# Patient Record
Sex: Female | Born: 1955 | Race: White | Hispanic: No | Marital: Married | Smoking: Never smoker
Health system: Southern US, Community
[De-identification: ages and names within clinical notes are randomized; demographics above are authoritative.]

## PROBLEM LIST (undated history)

## (undated) DIAGNOSIS — M545 Low back pain, unspecified: Secondary | ICD-10-CM

## (undated) DIAGNOSIS — E785 Hyperlipidemia, unspecified: Secondary | ICD-10-CM

## (undated) DIAGNOSIS — M5430 Sciatica, unspecified side: Secondary | ICD-10-CM

## (undated) DIAGNOSIS — J029 Acute pharyngitis, unspecified: Secondary | ICD-10-CM

## (undated) DIAGNOSIS — R002 Palpitations: Secondary | ICD-10-CM

## (undated) DIAGNOSIS — R509 Fever, unspecified: Secondary | ICD-10-CM

## (undated) DIAGNOSIS — T7840XA Allergy, unspecified, initial encounter: Secondary | ICD-10-CM

## (undated) DIAGNOSIS — E669 Obesity, unspecified: Secondary | ICD-10-CM

## (undated) DIAGNOSIS — R059 Cough, unspecified: Secondary | ICD-10-CM

## (undated) DIAGNOSIS — R7303 Prediabetes: Secondary | ICD-10-CM

## (undated) DIAGNOSIS — K219 Gastro-esophageal reflux disease without esophagitis: Secondary | ICD-10-CM

## (undated) DIAGNOSIS — Z6835 Body mass index (BMI) 35.0-35.9, adult: Secondary | ICD-10-CM

## (undated) DIAGNOSIS — G4730 Sleep apnea, unspecified: Secondary | ICD-10-CM

## (undated) DIAGNOSIS — I4891 Unspecified atrial fibrillation: Secondary | ICD-10-CM

## (undated) DIAGNOSIS — I10 Essential (primary) hypertension: Secondary | ICD-10-CM

## (undated) DIAGNOSIS — H6690 Otitis media, unspecified, unspecified ear: Secondary | ICD-10-CM

## (undated) HISTORY — DX: Low back pain, unspecified: M54.50

## (undated) HISTORY — DX: Gastro-esophageal reflux disease without esophagitis: K21.9

## (undated) HISTORY — DX: Allergy, unspecified, initial encounter: T78.40XA

## (undated) HISTORY — PX: KNEE SURGERY: SHX244

## (undated) HISTORY — DX: Otitis media, unspecified, unspecified ear: H66.90

## (undated) HISTORY — DX: Essential (primary) hypertension: I10

## (undated) HISTORY — DX: Unspecified atrial fibrillation: I48.91

## (undated) HISTORY — DX: Sciatica, unspecified side: M54.30

## (undated) HISTORY — DX: Sleep apnea, unspecified: G47.30

## (undated) HISTORY — DX: Hyperlipidemia, unspecified: E78.5

## (undated) HISTORY — DX: Prediabetes: R73.03

## (undated) HISTORY — DX: Obesity, unspecified: E66.9

## (undated) HISTORY — PX: COLONOSCOPY: SHX174

## (undated) HISTORY — DX: Acute pharyngitis, unspecified: J02.9

## (undated) HISTORY — DX: Body mass index (BMI) 35.0-35.9, adult: Z68.35

## (undated) HISTORY — DX: Cough, unspecified: R05.9

## (undated) HISTORY — DX: Fever, unspecified: R50.9

## (undated) HISTORY — PX: POLYPECTOMY: SHX149

---

## 2002-08-30 ENCOUNTER — Ambulatory Visit (HOSPITAL_COMMUNITY): Admission: RE | Admit: 2002-08-30 | Discharge: 2002-08-30 | Payer: Self-pay

## 2002-10-08 ENCOUNTER — Ambulatory Visit (HOSPITAL_BASED_OUTPATIENT_CLINIC_OR_DEPARTMENT_OTHER): Admission: RE | Admit: 2002-10-08 | Discharge: 2002-10-08 | Payer: Self-pay | Admitting: Orthopedic Surgery

## 2006-10-07 HISTORY — PX: OTHER SURGICAL HISTORY: SHX169

## 2006-12-24 ENCOUNTER — Encounter: Admission: RE | Admit: 2006-12-24 | Discharge: 2006-12-24 | Payer: Self-pay | Admitting: Orthopedic Surgery

## 2007-01-15 ENCOUNTER — Encounter: Admission: RE | Admit: 2007-01-15 | Discharge: 2007-01-15 | Payer: Self-pay | Admitting: Orthopedic Surgery

## 2007-02-05 ENCOUNTER — Encounter: Admission: RE | Admit: 2007-02-05 | Discharge: 2007-02-05 | Payer: Self-pay | Admitting: Orthopedic Surgery

## 2007-06-05 ENCOUNTER — Emergency Department (HOSPITAL_COMMUNITY): Admission: EM | Admit: 2007-06-05 | Discharge: 2007-06-06 | Payer: Self-pay | Admitting: Emergency Medicine

## 2007-08-17 ENCOUNTER — Inpatient Hospital Stay (HOSPITAL_COMMUNITY): Admission: RE | Admit: 2007-08-17 | Discharge: 2007-08-20 | Payer: Self-pay | Admitting: Neurosurgery

## 2009-07-01 IMAGING — CR DG LUMBAR SPINE 2-3V
1 series · 1 of 1 positions shown · non-contrast
Comparison: none

CLINICAL DATA: Lumbar spondylosis and disc herniation. 
 LUMBAR SPINE ? 2 VIEW 08/17/07:

[view not recorded]
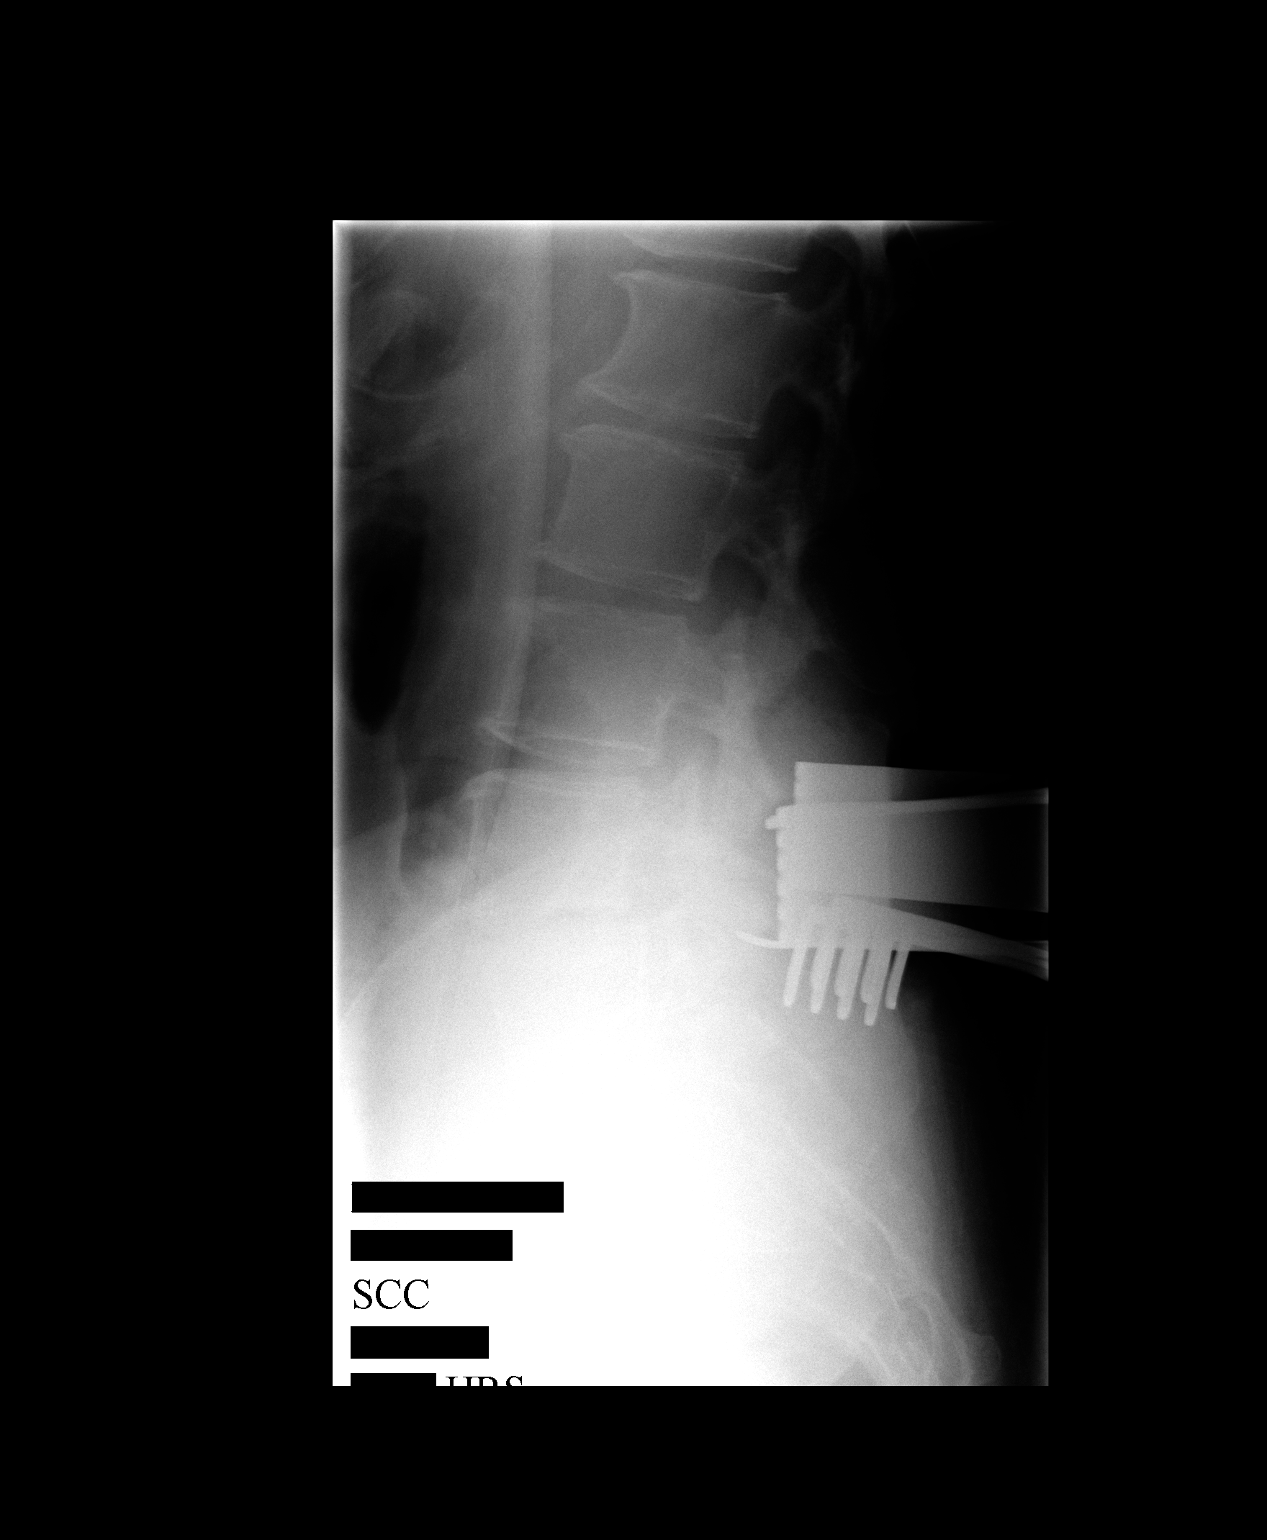

[1 of 1 positions shown; findings below may reference images not displayed]

FINDINGS: The first film at 5015 hours demonstrates placement of a spinal needle opposite the L3 spinous process.  
 Second film at 4304 hours demonstrates retractors and instruments opposite L4 and L5.
IMPRESSION: Lumbar vertebral body localization during posterior lumbar fusion surgery.  L3, L4 and L5 levels were localized.

## 2009-12-14 ENCOUNTER — Encounter: Admission: RE | Admit: 2009-12-14 | Discharge: 2009-12-14 | Payer: Self-pay | Admitting: Family Medicine

## 2011-02-19 NOTE — Op Note (Signed)
[name], [name] NO.:  [id number]   MEDICAL RECORD NO.:  [id number]          PATIENT TYPE:  INP   LOCATION:  3012                         FACILITY:  [facility]   PHYSICIAN:  Clydene Fake, M.D.  DATE OF BIRTH:  18-Feb-1956   DATE OF PROCEDURE:  08/17/2007  DATE OF DISCHARGE:                               OPERATIVE REPORT   PREOPERATIVE DIAGNOSES:  1. Lumbar stenosis.  2. Unstable spondylolisthesis.  3. Spondylosis at the lumbar 3-4 and 4-5 levels.   POSTOPERATIVE DIAGNOSES:  1. Lumbar stenosis.  2. Unstable spondylolisthesis.  3. Spondylosis at the lumbar 3-4 and 4-5 levels.   OPERATION PERFORMED:  1. Decompressive laminectomy.  2. Decompressing of the lumbar 3, lumbar 4 and lumbar 5 nerve roots      bilaterally (three levels).  3. Posterior lumbar interbody fusion, lumbar 3-4 and 4-5 (two levels).  4. Saber interbody cages at two levels at 3-4 and 4-5.  5. Segmented Expedient pedicle screw fixation, lumbar 3 through 5.  6. Posterolateral fusion, lumbar 3 through 5 (two levels), and      autograft at the same incision, infused bone morphogenic protein.   SURGEON:  Clydene Fake, M.D.   ASSISTANT:  Coletta Memos, M.D.   ANESTHESIA:  General endotracheal tube anesthesia.   ESTIMATED BLOOD LOSS:  The estimated blood loss was 500 mL and blood  given was 300 mL, Cell Saver return.   COMPLICATIONS:  None.   DRAINS:  None.   REASON FOR THE OPERATION:  The patient is a 55 year old woman who has  been having severe back and leg pain, worse on the right, and with  trouble walking.  The MRI and x-rays showed unstable spondylolisthesis  at L3-4 and 4-5 with severe stenosis, worse at 4-5.  The patient was  brought in for decompression and fusion.   OPERATION IN DETAIL:  The patient was brought into the operating room  and general anesthesia was induced.  The patient was placed in the prone  position on a Wilson frame.  All pressure points were padded.   The  patient was prepped and draped in a sterile fashion.  The site of  incision was injected with 20 mL of 1% lidocaine with epinephrine.  A  needle was placed in the interspace.  X-rays were obtained.  The needle  was pointing at the L3 spinous process just above the 3-4 interspace.  An incision was then made and centered where the needle was, and  then  actually taken down caudally.  This was then taken down to the fascia  and hemostasis was obtained with Bovie cauterization.  The fascia was  incised to the bottom of L2, all of 3-4 and the top of L5 spinous  processes.  Subperiosteal dissection was carried down to the spinous  processes to the lamina exposing the facets at 3-4 and 4-5, and exposing  the transverse processes of 3, 4 and 5 bilaterally.  Markers were placed  in the 3-4 and 4-5 interspaces and other x-rays were obtained confirming  our positioning.   Self-retaining retractors were placed  and the laminectomy was then  started.  Leksell rongeurs, Kerrison punches and high-speed drill were  used removing the spinous process, the lamina and the medial facets, and  even a little bit more lateral decompression so we could decompress the  3, 4 and 5 roots bilaterally.  Foraminotomies were done over all the  roots and there was some significant narrowing over the left 5 root, and  especially over the right L4 root, which we spent some significant time  decompressing out to the lateral remnant and the rest of the pars, and  decompressing.   Attention was then directed to the disk spaces.  At 4-5 the disk space  was incised.  Diskectomy was done.  We found some lateral disk  herniation that we removed.  This also helped decompress the 4 root.  We  distracted the interspace up to 11 mm and prepared the interspace for  interbody fusion with the various broaches.  This process then was  repeated at the 3-4 level in preparing this disk space and for interbody  fusion.   We then  entered to the 4-5 level.  We were then able to distract up to  11 mm; and, 11 mm high by 9 wide Saber interbody cages were then packed  with infused bone morphogenic protein and autograft bone.  All the bone  remnants were removed during the laminectomy and facetectomies.  It was  cleaned from its soft tissue and chopped into small pieces; and, this  bone was then packed into the two cages.  We packed bone into the  interspace, holding distraction on one side, tapped a Saber cage into  the other, removed the distractor and tapped a second cage at the 4-5  level.  We had countersunk this a few millimeters.  We had good position  of the cages with no pressure on the nerve roots.   Attention was then directed back up the 3-4 and we packed two 9 x 9 mm  Saber cages with infused bone morphogenic protein and autograft bone.  We packed autograft bone into the disk space and tapped the bone grafts  to the cages into place in the interbody space again checking their  position to insure they were a few millimeters down and there was no  pressure on the nerve roots.  We irrigated with antibiotic solution.  We  had good hemostasis in the epidural space.   At this point we decorticated the lateral facets, pars and transverse  processes of L3, L4 and L5 bilaterally using fluoroscopy.  Then the  pedicle entry point for L3 on the right was decorticated with the high-  speed drill.  We placed a pedicle probe down the pedicle, checked it  with a small ball probe and we had good surety of bony circumference.  We tapped the hole and then placed a pedicle screw.  We used Expedient  screws.  A 50 mm screw was used in L3.  A 45 mm screw at L4 and L5.  We  repeated this process on the right side at 4 and 5, placed some screws,  and then we placed three screws on the left side.   Final AP and lateral fluoroscopic images showed good position of the  screws and the interbody cages.  We then placed the infused  bone  morphogenic protein in the posterolateral gutters for posterolateral  fusion in L3 through L5.  We placed rods into the screw heads, placed  locking  nuts and then the final tightening was done.  We placed the rest  of the autograft bone in the posterolateral gutters bilaterally for  posterolateral fusion from L3 to L5.  The retractors were removed.  We  had good hemostasis.  We checked the dura and nerve roots again, and we  had good decompression of the 3, 4 and 5 nerve roots and the central  canal.  We placed some gel over the lateral gutters of the dura and  facets so no bone fragments would go down and hit the roots.  The  retractors were removed.  Then the fascia was closed with 0-Vicryl  interrupted sutures, and the subcutaneous tissue was closed with 0, 2-0  and 3-0 Vicryl interrupted sutures.  The skin was closed benzoin and  Steri-Strips, and a dressing was placed.   The patient was placed back in the supine position where she was  reversed her from anesthesia and was transferred to the recovery room in  stable condition.           ______________________________  Clydene Fake, M.D.     JRH/MEDQ  D:  08/17/2007  T:  08/18/2007  Job:  [id number]

## 2011-02-22 NOTE — Op Note (Signed)
NAME:  [name], COTUGNO NO.:  [id number]   MEDICAL RECORD NO.:  [id number]                   PATIENT TYPE:  AMB   LOCATION:  DSC                                  FACILITY:  [facility]   PHYSICIAN:  Thera Flake., M.D.             DATE OF BIRTH:  04-Oct-1956   DATE OF PROCEDURE:  10/08/2002  DATE OF DISCHARGE:                                 OPERATIVE REPORT   INDICATIONS:  The patient is a 55 year old with catching and pain in the  knee with an MRI showing possible meniscal tearing but osteonecrosis.  She  was followed for four to six weeks with continued catching, was counseled  that the catching may have been on the basis of meniscal tearing, other  diagnoses in the knee or even a fragmentation of her condyle, and was  thought to be amenable to an arthroscopic evaluation.   PREOPERATIVE DIAGNOSES:  1. Torn medial meniscus.  2. Prominent fibrotic plica.  3. Chondromalacia, medial facet of patella.  4. Chondromalacia of lateral compartment.   POSTOPERATIVE DIAGNOSES:  1. Torn medial meniscus.  2. Prominent fibrotic plica.  3. Chondromalacia, medial facet of patella.  4. Chondromalacia of lateral compartment.   OPERATION:  1. Debridement chondroplasty, patellofemoral joint and lateral compartment.  2. Partial medial meniscectomy.  3. Excision of plica.   SURGEON:  Dyke Brackett, M.D.   ANESTHESIA:  General.   DESCRIPTION OF PROCEDURE:  With arthroscope through superomedial,  inferomedial and inferolateral portals, systemic inspection of the knee  showed the patient to have a moderate amount of grade 3 chondromalacia, the  majority of the medial facet of the patella debrided.  The trochlear groove  itself was spared.   There was a very thick fibrotic plica, in fact, it made entering the  superomedial aspect of the joint with the inflow somewhat difficult; this  was band-like, thickly fibrotic, thought potentially a definitely  symptomatic  plica; it was excised.  There as a small fraying tear of the  lateral meniscus requiring resection of about 10% of the meniscus substance.  Thorough probing of the medial femoral condyle, despite the diagnosis of  osteonecrosis, showed no evidence of a collapse or fragmentation.  Lateral  compartment showed mild fibrillation consistent with grade 2 arthritis on  both sides of the joint which was debrided.  ACL showed laxity consistent  with laxity or possible chronic injury.  There was an aberrant band with  possible ligamentum mucosum versus a band of the ACL that was attaching in  an abnormal location, which may have been binding relative to the joint  motion and extension; this was  debrided.  PCL was intact.  Knee drained free of fluid, portals closed with  nylon, knee infiltrated with Marcaine and morphine with addition of 40 mg of  Depo-Medrol intra-articularly with Marcaine alone into the portals; a total  of 32 cc of  0.5% Marcaine were used, light and compressive sterile dressing  applied.                                               Thera Flake., M.D.    WDC/MEDQ  D:  10/08/2002  T:  10/08/2002  Job:  [id number]

## 2011-02-22 NOTE — Discharge Summary (Signed)
[name], [name] NO.:  [id number]   MEDICAL RECORD NO.:  [id number]          PATIENT TYPE:  INP   LOCATION:  3012                         FACILITY:  [facility]   PHYSICIAN:  Clydene Fake, M.D.  DATE OF BIRTH:  [birth year]   DATE OF ADMISSION:  08/17/2007  DATE OF DISCHARGE:  08/20/2007                               DISCHARGE SUMMARY   ADMISSION DIAGNOSIS:  Stenosis, unstable spondylolisthesis and  spondylosis.   DISCHARGE DIAGNOSIS:  Stenosis, unstable spondylolisthesis and  spondylosis.   PROCEDURE:  Decompressive laminectomy of L3, 4 and 5 with posterior  interbody fusion at L3-4 and L4-5 with Saber interbody cages at 2  levels, Expedient pedicles for fixation at L3 through L5, posterolateral  fusions L3 through L5 with BMP and autograft.   REASON FOR ADMISSION:  The patient is a 55 year old woman who has been  having back and leg pain, trouble walking, severe stenosis at L4-5 and  L3-4.  The patient admitted for decompression and fusion.   HOSPITAL COURSE:  The patient admitted on the day of surgery and  underwent the procedure above without complication.  Postoperative, the  patient was transferred to the recovery room and then to the floor.  She  did well with less pain right away, was up ambulating.  PT/OT worked  with the patient and continued to do well.  Incision looked good.   The patient could ambulate much better with much less leg pain and she  was doing great and was discharged home in stable condition on August 20, 2007 with discharge meds same as prehospitalization plus Cipro for a  week, Vicodin ES p.r.n., Flexeril p.r.n.  Followup will be in 2 to 3  weeks in my office.  Up with brace, no strenuous activity.           ______________________________  Clydene Fake, M.D.     JRH/MEDQ  D:  09/10/2007  T:  09/10/2007  Job:  [id number]

## 2011-07-16 LAB — TYPE AND SCREEN: ABO/RH(D): O POS

## 2011-07-16 LAB — BASIC METABOLIC PANEL
BUN: 11
Calcium: 9.9
GFR calc non Af Amer: >60
Potassium: 4
Sodium: 141

## 2011-07-16 LAB — URINALYSIS, ROUTINE W REFLEX MICROSCOPIC
Bilirubin Urine: NEGATIVE
Hgb urine dipstick: NEGATIVE
Specific Gravity, Urine: 1.011
pH: 7

## 2011-07-16 LAB — APTT: aPTT: 25

## 2011-07-16 LAB — PROTIME-INR: INR: 0.8

## 2011-07-16 LAB — URINE MICROSCOPIC-ADD ON

## 2011-07-16 LAB — CBC
HCT: 41.1
Platelets: 217
WBC: 5.1

## 2015-10-24 ENCOUNTER — Encounter: Payer: Self-pay | Admitting: Internal Medicine

## 2015-12-12 ENCOUNTER — Ambulatory Visit (AMBULATORY_SURGERY_CENTER): Payer: Self-pay | Admitting: *Deleted

## 2015-12-12 VITALS — Ht 64.5 in | Wt 230.0 lb

## 2015-12-12 DIAGNOSIS — Z1211 Encounter for screening for malignant neoplasm of colon: Secondary | ICD-10-CM

## 2015-12-12 MED ORDER — NA SULFATE-K SULFATE-MG SULF 17.5-3.13-1.6 GM/177ML PO SOLN: 1.0000 | Freq: Once | ORAL | Status: DC

## 2015-12-12 NOTE — Progress Notes (Signed)
No egg or soy allergy known to patient   issues with past sedation with any surgeries  or procedures is N/V post op , no intubation problems  No diet pills per patient No home 02 use per patient  No blood thinners per patient    emmi video to e mail      [email or web address]  Pt has a bridge that is not removal RS to 01-23-16 per pt's request due to work

## 2015-12-26 ENCOUNTER — Encounter: Payer: Self-pay | Admitting: Internal Medicine

## 2016-01-23 ENCOUNTER — Encounter: Payer: Self-pay | Admitting: Internal Medicine

## 2016-01-23 ENCOUNTER — Ambulatory Visit (AMBULATORY_SURGERY_CENTER): Payer: BC Managed Care – PPO | Admitting: Internal Medicine

## 2016-01-23 VITALS — BP 134/73 | HR 64 | Temp 99.8°F | Resp 20 | Ht 64.5 in | Wt 230.0 lb

## 2016-01-23 DIAGNOSIS — Z1211 Encounter for screening for malignant neoplasm of colon: Secondary | ICD-10-CM | POA: Diagnosis present

## 2016-01-23 DIAGNOSIS — D125 Benign neoplasm of sigmoid colon: Secondary | ICD-10-CM

## 2016-01-23 DIAGNOSIS — D124 Benign neoplasm of descending colon: Secondary | ICD-10-CM

## 2016-01-23 MED ORDER — SODIUM CHLORIDE 0.9 % IV SOLN: 500.0000 mL | INTRAVENOUS | Status: DC

## 2016-01-23 NOTE — Progress Notes (Signed)
Called to room to assist during endoscopic procedure.  Patient ID and intended procedure confirmed with present staff. Received instructions for my participation in the procedure from the performing physician.  

## 2016-01-23 NOTE — Progress Notes (Signed)
To recovery, report to [facility],, RN, VSS.

## 2016-01-23 NOTE — Op Note (Addendum)
[facility] Patient Name: [name] Procedure Date: 01/23/2016 2:01 PM MRN: [id number] Endoscopist: Jerene Bears , MD Age: 60 Date of Birth: 15-Mar-1956 Gender: Female Procedure:                Colonoscopy Indications:              Screening for colorectal malignant neoplasm, This                            is the patient's first colonoscopy Medicines:                Monitored Anesthesia Care Procedure:                Pre-Anesthesia Assessment:                           - Prior to the procedure, a History and Physical                            was performed, and patient medications and                            allergies were reviewed. The patient's tolerance of                            previous anesthesia was also reviewed. The risks                            and benefits of the procedure and the sedation                            options and risks were discussed with the patient.                            All questions were answered, and informed consent                            was obtained. Prior Anticoagulants: The patient has                            taken no previous anticoagulant or antiplatelet                            agents. ASA Grade Assessment: II - A patient with                            mild systemic disease. After reviewing the risks                            and benefits, the patient was deemed in                            satisfactory condition to undergo the procedure.  After obtaining informed consent, the colonoscope                            was passed under direct vision. Throughout the                            procedure, the patient's blood pressure, pulse, and                            oxygen saturations were monitored continuously. The                            Model PCF-H190DL [id number]) scope was introduced                            through the anus and advanced to the the cecum,              identified by appendiceal orifice and ileocecal                            valve. The colonoscopy was performed without                            difficulty. The patient tolerated the procedure                            well. The quality of the bowel preparation was                            good. The ileocecal valve, appendiceal orifice, and                            rectum were photographed. Scope In: 2:11:18 PM Scope Out: 2:25:18 PM Scope Withdrawal Time: 0 hours 12 minutes 12 seconds  Total Procedure Duration: 0 hours 14 minutes 0 seconds  Findings:                 The digital rectal exam was normal.                           Two sessile polyps were found in the sigmoid colon                            and descending colon. The polyps were 6 to 8 mm in                            size. These polyps were removed with a hot snare.                            Resection and retrieval were complete.                           Two sessile polyps were found in the descending  colon. The polyps were 4 to 5 mm in size. These                            polyps were removed with a cold snare. Resection                            and retrieval were complete.                           Internal hemorrhoids were found during                            retroflexion. The hemorrhoids were small. Complications:            No immediate complications. Estimated Blood Loss:     Estimated blood loss: none. Impression:               - Two 6 to 8 mm polyps in the sigmoid colon and in                            the descending colon, removed with a hot snare.                            Resected and retrieved.                           - Two 4 to 5 mm polyps in the descending colon,                            removed with a cold snare. Resected and retrieved. Recommendation:           - Patient has a contact number available for                            emergencies. The  signs and symptoms of potential                            delayed complications were discussed with the                            patient. Return to normal activities tomorrow.                            Written discharge instructions were provided to the                            patient.                           - Resume previous diet.                           - Continue present medications.                           -  No ibuprofen, naproxen, or other non-steroidal                            anti-inflammatory drugs for 2 weeks after polyp                            removal.                           - Await pathology results.                           - Repeat colonoscopy is recommended for                            surveillance. The colonoscopy date will be                            determined after pathology results from today's                            exam become available for review. Jerene Bears, MD 01/23/2016 2:30:37 PM This report has been signed electronically. Addendum Number: 1   Addendum Date: 01/23/2016 2:41:03 PM      Mild diverticulosis was present in the left colon. Jerene Bears, MD 01/23/2016 2:41:18 PM This report has been signed electronically.

## 2016-01-23 NOTE — Patient Instructions (Signed)
YOU HAD AN ENDOSCOPIC PROCEDURE TODAY AT [facility]:   Refer to the procedure report that was given to you for any specific questions about what was found during the examination.  If the procedure report does not answer your questions, please call your gastroenterologist to clarify.  If you requested that your care partner not be given the details of your procedure findings, then the procedure report has been included in a sealed envelope for you to review at your convenience later.  YOU SHOULD EXPECT: Some feelings of bloating in the abdomen. Passage of more gas than usual.  Walking can help get rid of the air that was put into your GI tract during the procedure and reduce the bloating. If you had a lower endoscopy (such as a colonoscopy or flexible sigmoidoscopy) you may notice spotting of blood in your stool or on the toilet paper. If you underwent a bowel prep for your procedure, you may not have a normal bowel movement for a few days.  Please Note:  You might notice some irritation and congestion in your nose or some drainage.  This is from the oxygen used during your procedure.  There is no need for concern and it should clear up in a day or so.  SYMPTOMS TO REPORT IMMEDIATELY:   Following lower endoscopy (colonoscopy or flexible sigmoidoscopy):  Excessive amounts of blood in the stool  Significant tenderness or worsening of abdominal pains  Swelling of the abdomen that is new, acute  Fever of 100F or higher    For urgent or emergent issues, a gastroenterologist can be reached at any hour by calling [phone].   DIET: Your first meal following the procedure should be a small meal and then it is ok to progress to your normal diet. Heavy or fried foods are harder to digest and may make you feel nauseous or bloated.  Likewise, meals heavy in dairy and vegetables can increase bloating.  Drink plenty of fluids but you should avoid alcoholic beverages for 24  hours.  ACTIVITY:  You should plan to take it easy for the rest of today and you should NOT DRIVE or use heavy machinery until tomorrow (because of the sedation medicines used during the test).    FOLLOW UP: Our staff will call the number listed on your records the next business day following your procedure to check on you and address any questions or concerns that you may have regarding the information given to you following your procedure. If we do not reach you, we will leave a message.  However, if you are feeling well and you are not experiencing any problems, there is no need to return our call.  We will assume that you have returned to your regular daily activities without incident.  If any biopsies were taken you will be contacted by phone or by letter within the next 1-3 weeks.  Please call us at [phone] if you have not heard about the biopsies in 3 weeks.    SIGNATURES/CONFIDENTIALITY: You and/or your care partner have signed paperwork which will be entered into your electronic medical record.  These signatures attest to the fact that that the information above on your After Visit Summary has been reviewed and is understood.  Full responsibility of the confidentiality of this discharge information lies with you and/or your care-partner.   NO ASPIRIN OR ANTI INFLAMMATORY PRODUCTS FOR 2 WEEKS   INFORMATION ON POLYPS AND HEMORRHOIDS GIVEN TO YOU TODAY

## 2016-01-23 NOTE — Progress Notes (Signed)
No egg or soy allergy known to patient  No issues with past sedation with any surgeries  or procedures, no intubation problems  No diet pills per patient No home 02 use per patient  No blood thinners per patient  Pt denies issues with constipation   

## 2016-01-24 ENCOUNTER — Telehealth: Payer: Self-pay | Admitting: *Deleted

## 2016-01-24 NOTE — Telephone Encounter (Signed)
  Follow up Call-  Call back number 01/23/2016  Post procedure Call Back phone  # [phone]  Permission to leave phone message Yes     Patient questions:  Do you have a fever, pain , or abdominal swelling? No. Pain Score  0 *  Have you tolerated food without any problems? Yes.    Have you been able to return to your normal activities? Yes.    Do you have any questions about your discharge instructions: Diet   No. Medications  No. Follow up visit  No.  Do you have questions or concerns about your Care? No.  Actions: * If pain score is 4 or above: No action needed, pain <4.

## 2016-01-29 ENCOUNTER — Encounter: Payer: Self-pay | Admitting: Internal Medicine

## 2018-12-22 ENCOUNTER — Encounter: Payer: Self-pay | Admitting: Internal Medicine

## 2019-03-10 ENCOUNTER — Ambulatory Visit (AMBULATORY_SURGERY_CENTER): Payer: Self-pay

## 2019-03-10 ENCOUNTER — Other Ambulatory Visit: Payer: Self-pay

## 2019-03-10 VITALS — Ht 64.0 in | Wt 179.0 lb

## 2019-03-10 DIAGNOSIS — Z86010 Personal history of colonic polyps: Secondary | ICD-10-CM

## 2019-03-10 MED ORDER — NA SULFATE-K SULFATE-MG SULF 17.5-3.13-1.6 GM/177ML PO SOLN: 1.0000 | Freq: Once | ORAL | 0 refills | Status: AC

## 2019-03-10 NOTE — Progress Notes (Signed)
No egg or soy allergy known to patient  No issues with past sedation with any surgeries  or procedures, no intubation problems  No diet pills per patient No home 02 use per patient  No blood thinners per patient  Pt denies issues with constipation  No A fib or A flutter  EMMI video sent to pt's e mail  

## 2019-03-25 ENCOUNTER — Encounter: Payer: BC Managed Care – PPO | Admitting: Internal Medicine

## 2019-04-12 ENCOUNTER — Telehealth: Payer: Self-pay | Admitting: Internal Medicine

## 2019-04-12 ENCOUNTER — Telehealth: Payer: Self-pay | Admitting: *Deleted

## 2019-04-12 MED ORDER — SUPREP BOWEL PREP KIT 17.5-3.13-1.6 GM/177ML PO SOLN: 1.0000 | Freq: Once | ORAL | 0 refills | Status: AC

## 2019-04-12 NOTE — Telephone Encounter (Signed)
Suprep sent to [facility] [address]

## 2019-04-12 NOTE — Telephone Encounter (Signed)
Message left for pt

## 2019-04-12 NOTE — Telephone Encounter (Signed)

## 2019-04-12 NOTE — Telephone Encounter (Signed)

## 2019-04-12 NOTE — Telephone Encounter (Signed)
Patient needs her suprep for tomorrows procedure

## 2019-04-13 ENCOUNTER — Encounter: Payer: BC Managed Care – PPO | Admitting: Internal Medicine

## 2019-04-13 ENCOUNTER — Encounter: Payer: Self-pay | Admitting: Internal Medicine

## 2019-04-13 ENCOUNTER — Ambulatory Visit (AMBULATORY_SURGERY_CENTER): Payer: BC Managed Care – PPO | Admitting: Internal Medicine

## 2019-04-13 ENCOUNTER — Other Ambulatory Visit: Payer: Self-pay

## 2019-04-13 VITALS — BP 114/68 | HR 59 | Temp 98.4°F | Resp 15 | Ht 64.0 in | Wt 179.0 lb

## 2019-04-13 DIAGNOSIS — D124 Benign neoplasm of descending colon: Secondary | ICD-10-CM | POA: Diagnosis not present

## 2019-04-13 DIAGNOSIS — K635 Polyp of colon: Secondary | ICD-10-CM

## 2019-04-13 DIAGNOSIS — D123 Benign neoplasm of transverse colon: Secondary | ICD-10-CM

## 2019-04-13 DIAGNOSIS — Z860100 Personal history of colon polyps, unspecified: Secondary | ICD-10-CM

## 2019-04-13 MED ORDER — SODIUM CHLORIDE 0.9 % IV SOLN: 500.0000 mL | Freq: Once | INTRAVENOUS | Status: DC

## 2019-04-13 NOTE — Op Note (Signed)
[facility] Patient Name: [name] Procedure Date: 04/13/2019 2:24 PM MRN: [id number] Endoscopist: Jerene Bears , MD Age: 63 Referring MD:  Date of Birth: January 18, 1956 Gender: Female Account #: [id number] Procedure:                Colonoscopy Indications:              High risk colon cancer surveillance: Personal                            history of multiple (3 or more) adenomas, Last                            colonoscopy 3 years ago Medicines:                Monitored Anesthesia Care Procedure:                Pre-Anesthesia Assessment:                           - Prior to the procedure, a History and Physical                            was performed, and patient medications and                            allergies were reviewed. The patient's tolerance of                            previous anesthesia was also reviewed. The risks                            and benefits of the procedure and the sedation                            options and risks were discussed with the patient.                            All questions were answered, and informed consent                            was obtained. Prior Anticoagulants: The patient has                            taken no previous anticoagulant or antiplatelet                            agents. ASA Grade Assessment: II - A patient with                            mild systemic disease. After reviewing the risks                            and benefits, the patient was deemed in  satisfactory condition to undergo the procedure.                           After obtaining informed consent, the colonoscope                            was passed under direct vision. Throughout the                            procedure, the patient's blood pressure, pulse, and                            oxygen saturations were monitored continuously. The                            Colonoscope was introduced through the anus  and                            advanced to the cecum, identified by appendiceal                            orifice and ileocecal valve. The colonoscopy was                            performed without difficulty. The patient tolerated                            the procedure well. The quality of the bowel                            preparation was good. The ileocecal valve,                            appendiceal orifice, and rectum were photographed. Scope In: 2:33:15 PM Scope Out: 2:49:06 PM Scope Withdrawal Time: 0 hours 12 minutes 41 seconds  Total Procedure Duration: 0 hours 15 minutes 51 seconds  Findings:                 The digital rectal exam was normal.                           A 8 mm polyp was found in the hepatic flexure. The                            polyp was sessile. The polyp was removed with a                            cold snare. Resection and retrieval were complete.                           A 3 mm polyp was found in the descending colon. The                            polyp was  sessile. The polyp was removed with a                            cold snare. Resection and retrieval were complete.                           Multiple medium-mouthed diverticula were found in                            the descending colon.                           Internal hemorrhoids were found during                            retroflexion. The hemorrhoids were small. Complications:            No immediate complications. Estimated Blood Loss:     Estimated blood loss was minimal. Impression:               - One 8 mm polyp at the hepatic flexure, removed                            with a cold snare. Resected and retrieved.                           - One 3 mm polyp in the descending colon, removed                            with a cold snare. Resected and retrieved.                           - Diverticulosis in the descending colon.                           - Internal  hemorrhoids. Recommendation:           - Patient has a contact number available for                            emergencies. The signs and symptoms of potential                            delayed complications were discussed with the                            patient. Return to normal activities tomorrow.                            Written discharge instructions were provided to the                            patient.                           - Resume previous diet.                           -  Continue present medications.                           - Await pathology results.                           - Repeat colonoscopy is recommended for                            surveillance. The colonoscopy date will be                            determined after pathology results from today's                            exam become available for review. Jerene Bears, MD 04/13/2019 2:53:50 PM This report has been signed electronically.

## 2019-04-13 NOTE — Patient Instructions (Signed)
Information on polyps, hemorrhoids and diverticulosis given to you today.  Await pathology results.  YOU HAD AN ENDOSCOPIC PROCEDURE TODAY AT [facility]:   Refer to the procedure report that was given to you for any specific questions about what was found during the examination.  If the procedure report does not answer your questions, please call your gastroenterologist to clarify.  If you requested that your care partner not be given the details of your procedure findings, then the procedure report has been included in a sealed envelope for you to review at your convenience later.  YOU SHOULD EXPECT: Some feelings of bloating in the abdomen. Passage of more gas than usual.  Walking can help get rid of the air that was put into your GI tract during the procedure and reduce the bloating. If you had a lower endoscopy (such as a colonoscopy or flexible sigmoidoscopy) you may notice spotting of blood in your stool or on the toilet paper. If you underwent a bowel prep for your procedure, you may not have a normal bowel movement for a few days.  Please Note:  You might notice some irritation and congestion in your nose or some drainage.  This is from the oxygen used during your procedure.  There is no need for concern and it should clear up in a day or so.  SYMPTOMS TO REPORT IMMEDIATELY:   Following lower endoscopy (colonoscopy or flexible sigmoidoscopy):  Excessive amounts of blood in the stool  Significant tenderness or worsening of abdominal pains  Swelling of the abdomen that is new, acute  Fever of 100F or higher   For urgent or emergent issues, a gastroenterologist can be reached at any hour by calling [phone].   DIET:  We do recommend a small meal at first, but then you may proceed to your regular diet.  Drink plenty of fluids but you should avoid alcoholic beverages for 24 hours.  ACTIVITY:  You should plan to take it easy for the rest of today and you should NOT  DRIVE or use heavy machinery until tomorrow (because of the sedation medicines used during the test).    FOLLOW UP: Our staff will call the number listed on your records 48-72 hours following your procedure to check on you and address any questions or concerns that you may have regarding the information given to you following your procedure. If we do not reach you, we will leave a message.  We will attempt to reach you two times.  During this call, we will ask if you have developed any symptoms of COVID 19. If you develop any symptoms (ie: fever, flu-like symptoms, shortness of breath, cough etc.) before then, please call [phone].  If you test positive for Covid 19 in the 2 weeks post procedure, please call and report this information to [place].    If any biopsies were taken you will be contacted by phone or by letter within the next 1-3 weeks.  Please call us at [phone] if you have not heard about the biopsies in 3 weeks.    SIGNATURES/CONFIDENTIALITY: You and/or your care partner have signed paperwork which will be entered into your electronic medical record.  These signatures attest to the fact that that the information above on your After Visit Summary has been reviewed and is understood.  Full responsibility of the confidentiality of this discharge information lies with you and/or your care-partner.

## 2019-04-13 NOTE — Progress Notes (Signed)
Called to room to assist during endoscopic procedure.  Patient ID and intended procedure confirmed with present staff. Received instructions for my participation in the procedure from the performing physician.  

## 2019-04-13 NOTE — Progress Notes (Signed)
A/ox3, pleased with MAC, report to RN 

## 2019-04-13 NOTE — Progress Notes (Signed)
Pt's states no medical or surgical changes since previsit or office visit.  VS/JB Temp CW

## 2019-04-15 ENCOUNTER — Telehealth: Payer: Self-pay | Admitting: *Deleted

## 2019-04-15 NOTE — Telephone Encounter (Signed)
  Follow up Call-  Call back number 04/13/2019  Post procedure Call Back phone  # [phone]  Permission to leave phone message Yes  Some recent data might be hidden     Patient questions:  Do you have a fever, pain , or abdominal swelling? No. Pain Score  0 *  Have you tolerated food without any problems? Yes.    Have you been able to return to your normal activities? Yes.    Do you have any questions about your discharge instructions: Diet   No. Medications  No. Follow up visit  No.  Do you have questions or concerns about your Care? No.  Actions: * If pain score is 4 or above: No action needed, pain <4.  1. Have you developed a fever since your procedure? NO  2.   Have you had an respiratory symptoms (SOB or cough) since your procedure? NO  3.   Have you tested positive for COVID 19 since your procedure NO  4.   Have you had any family members/close contacts diagnosed with the COVID 19 since your procedure?  NO   If yes to any of these questions please route to Joylene John, RN and Alphonsa Gin, RN.

## 2019-04-15 NOTE — Telephone Encounter (Signed)
First attempt, left VM.  

## 2019-04-20 ENCOUNTER — Encounter: Payer: Self-pay | Admitting: Internal Medicine

## 2019-12-13 ENCOUNTER — Ambulatory Visit: Payer: BC Managed Care – PPO

## 2021-05-18 ENCOUNTER — Other Ambulatory Visit: Payer: Self-pay

## 2021-05-18 ENCOUNTER — Ambulatory Visit (HOSPITAL_COMMUNITY)
Admission: EM | Admit: 2021-05-18 | Discharge: 2021-05-18 | Disposition: A | Discharge status: Home / Self Care | Payer: BC Managed Care – PPO

## 2021-05-18 ENCOUNTER — Other Ambulatory Visit: Payer: Self-pay | Admitting: Family Medicine

## 2021-05-18 ENCOUNTER — Encounter (HOSPITAL_COMMUNITY): Payer: Self-pay | Admitting: Emergency Medicine

## 2021-05-18 DIAGNOSIS — J40 Bronchitis, not specified as acute or chronic: Secondary | ICD-10-CM

## 2021-05-18 MED ORDER — PROMETHAZINE-DM 6.25-15 MG/5ML PO SYRP: 5.0000 mL | ORAL_SOLUTION | Freq: Four times a day (QID) | ORAL | 0 refills | Status: DC | PRN

## 2021-05-18 MED ORDER — PREDNISONE 20 MG PO TABS: 40.0000 mg | ORAL_TABLET | Freq: Every day | ORAL | 0 refills | Status: DC

## 2021-05-18 MED ORDER — ALBUTEROL SULFATE HFA 108 (90 BASE) MCG/ACT IN AERS: 1.0000 | INHALATION_SPRAY | Freq: Four times a day (QID) | RESPIRATORY_TRACT | 0 refills | Status: DC | PRN

## 2021-05-18 NOTE — Telephone Encounter (Signed)
Lane PA no longer at [facility]

## 2021-05-18 NOTE — ED Triage Notes (Signed)
Productive cough and congestion starting Tuesday. Hx of bronchitis. Negative home covid test. Denies fever, chest pain, SOB, N/V/D

## 2021-05-18 NOTE — ED Provider Notes (Signed)
[facility]    CSN: [id number] Arrival date & time: 05/18/21  0825      History   Chief Complaint Chief Complaint  Patient presents with   Cough    HPI [name] is a 65 y.o. female.   Patient presenting today with 5-day history of productive cough, congestion, fatigue.  Denies chest pain, shortness of breath, fever, chills, abdominal pain, nausea vomiting or diarrhea.  So far taking Mucinex with mild relief.  History of bronchitis and states this feels similar.  Has been around someone who was sick recently with bronchitis.  Home COVID test this morning negative.  No known chronic pulmonary issues.   Past Medical History:  Diagnosis Date   Allergy    GERD (gastroesophageal reflux disease)    Hypertension    Sleep apnea    wears cpap    There are no problems to display for this patient.   Past Surgical History:  Procedure Laterality Date   back fusion  2008   L3,L4,L5   COLONOSCOPY     KNEE SURGERY Left    x3   POLYPECTOMY      OB History   No obstetric history on file.      Home Medications    Prior to Admission medications   Medication Sig Start Date End Date Taking? Authorizing Provider  albuterol (VENTOLIN HFA) 108 (90 Base) MCG/ACT inhaler Inhale 1-2 puffs into the lungs every 6 (six) hours as needed for wheezing or shortness of breath. 05/18/21  Yes Volney American, PA-C  metoprolol tartrate (LOPRESSOR) 25 MG tablet Take 25 mg by mouth 2 (two) times daily.   Yes [provider]  predniSONE (DELTASONE) 20 MG tablet Take 2 tablets (40 mg total) by mouth daily with breakfast. 05/18/21  Yes Volney American, PA-C  promethazine-dextromethorphan (PROMETHAZINE-DM) 6.25-15 MG/5ML syrup Take 5 mLs by mouth 4 (four) times daily as needed for cough. 05/18/21  Yes Volney American, PA-C  amLODipine (NORVASC) 10 MG tablet Take 10 mg by mouth daily.    [provider]  Ashwagandha 500 MG CAPS Take 500 mg by mouth  daily.    [provider]  calcipotriene (DOVONOX) 0.005 % cream Apply topically 2 (two) times daily. Reported on 12/12/2015    [provider]  clobetasol cream (TEMOVATE) [id number] % Apply 1 application topically 2 (two) times daily. Reported on 12/12/2015    [provider]  fluticasone (FLONASE) 50 MCG/ACT nasal spray Place 1 spray into both nostrils daily. Reported on 12/12/2015    [provider]  ibuprofen (ADVIL,MOTRIN) 800 MG tablet Take 800 mg by mouth every 8 (eight) hours as needed.    [provider]    Family History Family History  Problem Relation Age of Onset   Colon cancer Neg Hx    Colon polyps Neg Hx    Esophageal cancer Neg Hx    Rectal cancer Neg Hx    Stomach cancer Neg Hx     Social History Social History   Tobacco Use   Smoking status: Never   Smokeless tobacco: Never  Substance Use Topics   Alcohol use: Yes    Alcohol/week: 0.0 standard drinks    Comment: occ   Drug use: No     Allergies   Bee pollen and Pollen extract   Review of Systems Review of Systems Per HPI  Physical Exam Triage Vital Signs ED Triage Vitals  Enc Vitals Group     BP  05/18/21 0926 (!) 146/72     Pulse Rate 05/18/21 0926 70     Resp 05/18/21 0926 18     Temp 05/18/21 0926 98.4 F (36.9 C)     Temp Source 05/18/21 0926 Oral     SpO2 05/18/21 0926 98 %     Weight --      Height --      Head Circumference --      Peak Flow --      Pain Score 05/18/21 0928 0     Pain Loc --      Pain Edu? --      Excl. in [place]? --    No data found.  Updated Vital Signs BP (!) 146/72 (BP Location: Right Arm)   Pulse 70   Temp 98.4 F (36.9 C) (Oral)   Resp 18   SpO2 98%   Visual Acuity Right Eye Distance:   Left Eye Distance:   Bilateral Distance:    Right Eye Near:   Left Eye Near:    Bilateral Near:     Physical Exam Vitals and nursing note reviewed.  Constitutional:      Appearance: Normal appearance. She is not ill-appearing.   HENT:     Head: Atraumatic.     Right Ear: Tympanic membrane normal.     Left Ear: Tympanic membrane normal.     Nose: Rhinorrhea present.     Mouth/Throat:     Mouth: Mucous membranes are moist.     Pharynx: Posterior oropharyngeal erythema present. No oropharyngeal exudate.  Eyes:     Extraocular Movements: Extraocular movements intact.     Conjunctiva/sclera: Conjunctivae normal.  Cardiovascular:     Rate and Rhythm: Normal rate and regular rhythm.     Heart sounds: Normal heart sounds.  Pulmonary:     Effort: Pulmonary effort is normal. No respiratory distress.     Breath sounds: Normal breath sounds. No wheezing or rales.  Abdominal:     General: Bowel sounds are normal.     Palpations: Abdomen is soft.  Musculoskeletal:        General: Normal range of motion.     Cervical back: Normal range of motion and neck supple.  Skin:    General: Skin is warm and dry.  Neurological:     Mental Status: She is alert and oriented to person, place, and time.  Psychiatric:        Mood and Affect: Mood normal.        Thought Content: Thought content normal.        Judgment: Judgment normal.     UC Treatments / Results  Labs (all labs ordered are listed, but only abnormal results are displayed) Labs Reviewed - No data to display  EKG   Radiology No results found.  Procedures Procedures (including critical care time)  Medications Ordered in UC Medications - No data to display  Initial Impression / Assessment and Plan / UC Course  I have reviewed the triage vital signs and the nursing notes.  Pertinent labs & imaging results that were available during my care of the patient were reviewed by me and considered in my medical decision making (see chart for details).     Exam and vital signs overall reassuring.  Home COVID test negative this morning.  Suspect viral illness causing bronchitis.  Will treat with prednisone burst, albuterol, Phenergan DM, Mucinex.  Supportive  home care reviewed.  Return for acutely worsening symptoms.  Declines work  note.  Final Clinical Impressions(s) / UC Diagnoses   Final diagnoses:  Bronchitis   Discharge Instructions   None    ED Prescriptions     Medication Sig Dispense Auth. Provider   predniSONE (DELTASONE) 20 MG tablet Take 2 tablets (40 mg total) by mouth daily with breakfast. 10 tablet Volney American, PA-C   albuterol (VENTOLIN HFA) 108 (90 Base) MCG/ACT inhaler Inhale 1-2 puffs into the lungs every 6 (six) hours as needed for wheezing or shortness of breath. [place], [place]   promethazine-dextromethorphan (PROMETHAZINE-DM) 6.25-15 MG/5ML syrup Take 5 mLs by mouth 4 (four) times daily as needed for cough. 100 mL Volney American, [place]      PDMP not reviewed this encounter.   Volney American, [place] 05/18/21 1950

## 2021-06-09 ENCOUNTER — Other Ambulatory Visit: Payer: Self-pay | Admitting: Family Medicine

## 2021-12-20 ENCOUNTER — Other Ambulatory Visit: Payer: Self-pay | Admitting: Orthopedic Surgery

## 2021-12-27 ENCOUNTER — Encounter (HOSPITAL_BASED_OUTPATIENT_CLINIC_OR_DEPARTMENT_OTHER): Payer: Self-pay | Admitting: Orthopedic Surgery

## 2021-12-27 ENCOUNTER — Other Ambulatory Visit: Payer: Self-pay

## 2021-12-28 NOTE — Progress Notes (Signed)
Ensure presurgery drink given, drink by 0900 DOS. Instruction taped to bottle, reviewed instruction, pt verbalized understanding ? ? ? ? ?Enhanced Recovery after Surgery for Orthopedics ?Enhanced Recovery after Surgery is a protocol used to improve the stress on your body and your recovery after surgery. ? ?Patient Instructions ? ?The night before surgery:  ?No food after midnight. ONLY clear liquids after midnight ? ?The day of surgery (if you do NOT have diabetes):  ?Drink ONE (1) Pre-Surgery Clear Ensure as directed.   ?This drink was given to you during your hospital  ?pre-op appointment visit. ?The pre-op nurse will instruct you on the time to drink the  ?Pre-Surgery Ensure depending on your surgery time. ?Finish the drink at the designated time by the pre-op nurse.  ?Nothing else to drink after completing the  ?Pre-Surgery Clear Ensure. ? ?The day of surgery (if you have diabetes): ?Drink ONE (1) Gatorade 2 (G2) as directed. ?This drink was given to you during your hospital  ?pre-op appointment visit.  ?The pre-op nurse will instruct you on the time to drink the  ? Gatorade 2 (G2) depending on your surgery time. ?Color of the Gatorade may vary. Red is not allowed. ?Nothing else to drink after completing the  ?Gatorade 2 (G2). ? ?       If you have questions, please contact your surgeon?s office. ?

## 2022-01-08 ENCOUNTER — Ambulatory Visit (HOSPITAL_BASED_OUTPATIENT_CLINIC_OR_DEPARTMENT_OTHER): Payer: BC Managed Care – PPO | Admitting: Anesthesiology

## 2022-01-08 ENCOUNTER — Ambulatory Visit (HOSPITAL_BASED_OUTPATIENT_CLINIC_OR_DEPARTMENT_OTHER)
Admission: RE | Admit: 2022-01-08 | Discharge: 2022-01-08 | Disposition: A | Discharge status: Home / Self Care | Payer: BC Managed Care – PPO | Source: Ambulatory Visit | Attending: Orthopedic Surgery | Admitting: Orthopedic Surgery

## 2022-01-08 ENCOUNTER — Encounter (HOSPITAL_BASED_OUTPATIENT_CLINIC_OR_DEPARTMENT_OTHER)
Admission: RE | Disposition: A | Discharge status: Home / Self Care | Payer: Self-pay | Source: Ambulatory Visit | Attending: Orthopedic Surgery

## 2022-01-08 ENCOUNTER — Encounter (HOSPITAL_BASED_OUTPATIENT_CLINIC_OR_DEPARTMENT_OTHER): Payer: Self-pay | Admitting: Orthopedic Surgery

## 2022-01-08 ENCOUNTER — Other Ambulatory Visit: Payer: Self-pay

## 2022-01-08 DIAGNOSIS — M1990 Unspecified osteoarthritis, unspecified site: Secondary | ICD-10-CM | POA: Diagnosis not present

## 2022-01-08 DIAGNOSIS — I10 Essential (primary) hypertension: Secondary | ICD-10-CM | POA: Insufficient documentation

## 2022-01-08 DIAGNOSIS — Z833 Family history of diabetes mellitus: Secondary | ICD-10-CM | POA: Diagnosis not present

## 2022-01-08 DIAGNOSIS — D481 Neoplasm of uncertain behavior of connective and other soft tissue: Secondary | ICD-10-CM | POA: Diagnosis not present

## 2022-01-08 DIAGNOSIS — G4730 Sleep apnea, unspecified: Secondary | ICD-10-CM | POA: Diagnosis not present

## 2022-01-08 DIAGNOSIS — K219 Gastro-esophageal reflux disease without esophagitis: Secondary | ICD-10-CM | POA: Insufficient documentation

## 2022-01-08 DIAGNOSIS — R2232 Localized swelling, mass and lump, left upper limb: Secondary | ICD-10-CM | POA: Diagnosis present

## 2022-01-08 HISTORY — DX: Palpitations: R00.2

## 2022-01-08 HISTORY — PX: EXCISION METACARPAL MASS: SHX6372

## 2022-01-08 SURGERY — EXCISION METACARPAL MASS
Anesthesia: Monitor Anesthesia Care | Site: Finger | Laterality: Left

## 2022-01-08 MED ORDER — FENTANYL CITRATE (PF) 100 MCG/2ML IJ SOLN: 25.0000 ug | INTRAMUSCULAR | Status: DC | PRN

## 2022-01-08 MED ORDER — FENTANYL CITRATE (PF) 100 MCG/2ML IJ SOLN
INTRAMUSCULAR | Status: DC | PRN
  Administered 2022-01-08 (×2): 50 ug via INTRAVENOUS

## 2022-01-08 MED ORDER — ONDANSETRON HCL 4 MG/2ML IJ SOLN
INTRAMUSCULAR | Status: DC | PRN
  Administered 2022-01-08: 4 mg via INTRAVENOUS

## 2022-01-08 MED ORDER — ACETAMINOPHEN 500 MG PO TABS
1000.0000 mg | ORAL_TABLET | Freq: Once | ORAL | Status: AC
  Administered 2022-01-08: 1000 mg via ORAL

## 2022-01-08 MED ORDER — FENTANYL CITRATE (PF) 100 MCG/2ML IJ SOLN
INTRAMUSCULAR | Status: AC
  Filled 2022-01-08: qty 2

## 2022-01-08 MED ORDER — CEFAZOLIN SODIUM-DEXTROSE 2-4 GM/100ML-% IV SOLN
INTRAVENOUS | Status: AC
  Filled 2022-01-08: qty 100

## 2022-01-08 MED ORDER — LIDOCAINE HCL (CARDIAC) PF 100 MG/5ML IV SOSY
PREFILLED_SYRINGE | INTRAVENOUS | Status: DC | PRN
  Administered 2022-01-08: 40 mg via INTRAVENOUS

## 2022-01-08 MED ORDER — PROPOFOL 10 MG/ML IV BOLUS
INTRAVENOUS | Status: AC
  Filled 2022-01-08: qty 20

## 2022-01-08 MED ORDER — ACETAMINOPHEN 500 MG PO TABS
ORAL_TABLET | ORAL | Status: AC
  Filled 2022-01-08: qty 2

## 2022-01-08 MED ORDER — PROPOFOL 10 MG/ML IV BOLUS
INTRAVENOUS | Status: DC | PRN
Start: 2022-01-08 — End: 2022-01-08
  Administered 2022-01-08 (×2): 20 mg via INTRAVENOUS

## 2022-01-08 MED ORDER — 0.9 % SODIUM CHLORIDE (POUR BTL) OPTIME
TOPICAL | Status: DC | PRN
  Administered 2022-01-08: 1000 mL

## 2022-01-08 MED ORDER — CEFAZOLIN SODIUM-DEXTROSE 2-4 GM/100ML-% IV SOLN
2.0000 g | INTRAVENOUS | Status: AC
  Administered 2022-01-08: 2 g via INTRAVENOUS

## 2022-01-08 MED ORDER — TRAMADOL HCL 50 MG PO TABS: 50.0000 mg | ORAL_TABLET | Freq: Four times a day (QID) | ORAL | 0 refills | Status: DC | PRN

## 2022-01-08 MED ORDER — PROPOFOL 500 MG/50ML IV EMUL
INTRAVENOUS | Status: DC | PRN
  Administered 2022-01-08: 100 ug/kg/min via INTRAVENOUS

## 2022-01-08 MED ORDER — BUPIVACAINE HCL (PF) 0.25 % IJ SOLN
INTRAMUSCULAR | Status: DC | PRN
Start: 2022-01-08 — End: 2022-01-08
  Administered 2022-01-08: 7 mL

## 2022-01-08 MED ORDER — MIDAZOLAM HCL 2 MG/2ML IJ SOLN
INTRAMUSCULAR | Status: AC
  Filled 2022-01-08: qty 2

## 2022-01-08 MED ORDER — MIDAZOLAM HCL 5 MG/5ML IJ SOLN
INTRAMUSCULAR | Status: DC | PRN
Start: 2022-01-08 — End: 2022-01-08
  Administered 2022-01-08: 2 mg via INTRAVENOUS

## 2022-01-08 MED ORDER — LIDOCAINE HCL (PF) 0.5 % IJ SOLN
INTRAMUSCULAR | Status: DC | PRN
Start: 2022-01-08 — End: 2022-01-08
  Administered 2022-01-08: 50 mL via INTRAVENOUS

## 2022-01-08 MED ORDER — ONDANSETRON HCL 4 MG/2ML IJ SOLN
INTRAMUSCULAR | Status: AC
  Filled 2022-01-08: qty 2

## 2022-01-08 MED ORDER — LACTATED RINGERS IV SOLN
INTRAVENOUS | Status: DC
Start: 2022-01-08 — End: 2022-01-08

## 2022-01-08 MED ORDER — BUPIVACAINE HCL (PF) 0.25 % IJ SOLN
INTRAMUSCULAR | Status: AC
  Filled 2022-01-08: qty 30

## 2022-01-08 SURGICAL SUPPLY — 50 items
APL PRP STRL LF DISP 70% ISPRP (MISCELLANEOUS) ×1
BLADE MINI RND TIP GREEN BEAV (BLADE) IMPLANT
BLADE SURG 15 STRL LF DISP TIS (BLADE) ×2 IMPLANT
BLADE SURG 15 STRL SS (BLADE) ×2
BNDG CMPR 5X2 CHSV 1 LYR STRL (GAUZE/BANDAGES/DRESSINGS)
BNDG CMPR 5X3 CHSV STRCH STRL (GAUZE/BANDAGES/DRESSINGS)
BNDG CMPR 9X4 STRL LF SNTH (GAUZE/BANDAGES/DRESSINGS) ×1
BNDG COHESIVE 1X5 TAN STRL LF (GAUZE/BANDAGES/DRESSINGS) ×1 IMPLANT
BNDG COHESIVE 2X5 TAN ST LF (GAUZE/BANDAGES/DRESSINGS) IMPLANT
BNDG COHESIVE 3X5 TAN ST LF (GAUZE/BANDAGES/DRESSINGS) IMPLANT
BNDG ESMARK 4X9 LF (GAUZE/BANDAGES/DRESSINGS) ×1 IMPLANT
BNDG GAUZE ELAST 4 BULKY (GAUZE/BANDAGES/DRESSINGS) IMPLANT
CHLORAPREP W/TINT 26 (MISCELLANEOUS) ×3 IMPLANT
CORD BIPOLAR FORCEPS 12FT (ELECTRODE) ×3 IMPLANT
COVER BACK TABLE 60X90IN (DRAPES) ×3 IMPLANT
COVER MAYO STAND STRL (DRAPES) ×3 IMPLANT
CUFF TOURN SGL QUICK 18X4 (TOURNIQUET CUFF) IMPLANT
DRAIN PENROSE .5X12 LATEX STL (DRAIN) IMPLANT
DRAPE EXTREMITY T 121X128X90 (DISPOSABLE) ×3 IMPLANT
DRAPE SURG 17X23 STRL (DRAPES) ×3 IMPLANT
GAUZE SPONGE 4X4 12PLY STRL (GAUZE/BANDAGES/DRESSINGS) ×3 IMPLANT
GAUZE XEROFORM 1X8 LF (GAUZE/BANDAGES/DRESSINGS) ×3 IMPLANT
GLOVE SURG ORTHO LTX SZ8 (GLOVE) ×3 IMPLANT
GLOVE SURG UNDER POLY LF SZ8.5 (GLOVE) ×3 IMPLANT
GOWN STRL REUS W/ TWL LRG LVL3 (GOWN DISPOSABLE) ×2 IMPLANT
GOWN STRL REUS W/TWL LRG LVL3 (GOWN DISPOSABLE) ×4
GOWN STRL REUS W/TWL XL LVL3 (GOWN DISPOSABLE) ×4 IMPLANT
NDL HYPO 27GX1-1/4 (NEEDLE) IMPLANT
NDL SAFETY ECLIPSE 18X1.5 (NEEDLE) ×2 IMPLANT
NEEDLE HYPO 18GX1.5 SHARP (NEEDLE)
NEEDLE HYPO 27GX1-1/4 (NEEDLE) ×2 IMPLANT
NS IRRIG 1000ML POUR BTL (IV SOLUTION) ×3 IMPLANT
PACK BASIN DAY SURGERY FS (CUSTOM PROCEDURE TRAY) ×3 IMPLANT
PAD CAST 3X4 CTTN HI CHSV (CAST SUPPLIES) IMPLANT
PADDING CAST ABS 3INX4YD NS (CAST SUPPLIES)
PADDING CAST ABS 4INX4YD NS (CAST SUPPLIES)
PADDING CAST ABS COTTON 3X4 (CAST SUPPLIES) IMPLANT
PADDING CAST ABS COTTON 4X4 ST (CAST SUPPLIES) ×2 IMPLANT
PADDING CAST COTTON 3X4 STRL (CAST SUPPLIES)
SPIKE FLUID TRANSFER (MISCELLANEOUS) IMPLANT
SPLINT FINGER 3.25 BULB 911905 (SOFTGOODS) ×1 IMPLANT
SPLINT PLASTER CAST XFAST 3X15 (CAST SUPPLIES) IMPLANT
SPLINT PLASTER XTRA FASTSET 3X (CAST SUPPLIES)
STOCKINETTE 4X48 STRL (DRAPES) ×3 IMPLANT
SUT ETHILON 4 0 PS 2 18 (SUTURE) ×3 IMPLANT
SUT VIC AB 4-0 P2 18 (SUTURE) IMPLANT
SYR BULB EAR ULCER 3OZ GRN STR (SYRINGE) ×3 IMPLANT
SYR CONTROL 10ML LL (SYRINGE) ×1 IMPLANT
TOWEL GREEN STERILE FF (TOWEL DISPOSABLE) ×6 IMPLANT
UNDERPAD 30X36 HEAVY ABSORB (UNDERPADS AND DIAPERS) ×3 IMPLANT

## 2022-01-08 NOTE — H&P (Signed)
?[name] is an 66 y.o. female.   ?Chief Complaint: Mass left index finger ?HPI: [name] is a 66 year old right-hand-dominant female who comes in complaining of a mass over the middle phalangeal joint left index finger. She states she has had this mass for 6 to 9 months and is gradually enlarged. She has no history of injury. She states it will occasionally hurt with a throbbing aching pain 7-8/10 with no apparent cause. She has not had any treatment or tried anything for this. She has no history of diabetes thyroid problems or gout. She does have a history of arthritis. Family history is positive for diabetes. She is fShe was referred for MRI to assess the exact extent of the mass. This has been done and read up to Dr. 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 ?INDICATION: Soft tissue mass, hand, superficial \ mass of finger of left hand \ R22.32 Mass of finger of left hand  ? ?COMPARISON: 11/21/2021 index finger radiograph. ? ?TECHNIQUE: Attention was directed to the finger finger. Multiplanar, multi-sequence MR images using surface coil were obtained without contrast. ? ? ?FINDINGS: ? ?There is a well-defined multilobulated mass measuring 2 x 1.7 x 1.5 cm (AP, TR, CC) showing T1 hypointense/T2 intermediate signal intensity abutting the dorsal surface of the second middle phalanx resulting in mild remodeling of the dorsal surface and edema-like marrow signal within the phalanx. The mass infiltrates the extensor tendon in this region. The ulnar aspect of the mass extends volarly and abuts the A4 pulley, possibly extending 1-2 mm within the ulnar margin of the flexor tendon. ? ?Mild degenerative changes of the second DIP joint. ? ?Curvilinear ossification at the second PIP joint, likely sequela for trauma. ? ?Mild degenerative changes of the second and third MTP joints. ?Incidentally noted small ganglion cysts extending posteriorly from the  third digit PIP joints. ? ? ?CONCLUSION: ?  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  ? ?Electronically Signed By Resident/Fellow: Eartha Inch, MDollowed by Jonni Sanger brake FNP  ? ?Past Medical History:  ?Diagnosis Date  ? Allergy   ? GERD (gastroesophageal reflux disease)   ? Hypertension   ? Palpitations   ? Sleep apnea   ? wears cpap  ? ? ?Past Surgical History:  ?Procedure Laterality Date  ? back fusion  2008  ? L3,L4,L5  ? COLONOSCOPY    ? KNEE SURGERY Left   ? x3  ? POLYPECTOMY    ? ? ?Family History  ?Problem Relation Age of Onset  ? Colon cancer Neg Hx   ? Colon polyps Neg Hx   ? Esophageal cancer Neg Hx   ? Rectal cancer Neg Hx   ? Stomach cancer Neg Hx   ? ?Social History:  reports that she has never smoked. She has never used smokeless tobacco. She reports current alcohol use. She reports that she does not use drugs. ? ?Allergies:  ?Allergies  ?Allergen Reactions  ? Bee Pollen Other (See Comments)  ?  Per allergy test  ? Pollen Extract   ? ? ?  Medications Prior to Admission  ?Medication Sig Dispense Refill  ? amLODipine (NORVASC) 10 MG tablet Take 10 mg by mouth daily.    ? calcipotriene (DOVONOX) 0.005 % cream Apply topically 2 (two) times daily. Reported on 12/12/2015    ? clobetasol cream (TEMOVATE) 2.26 % Apply 1 application topically 2 (two) times daily. Reported on 12/12/2015    ? fluticasone (FLONASE) 50 MCG/ACT nasal spray Place 1 spray into both nostrils daily as needed. Reported on 12/12/2015    ? ibuprofen (ADVIL,MOTRIN) 800 MG  tablet Take 800 mg by mouth every 8 (eight) hours as needed.    ? metoprolol tartrate (LOPRESSOR) 25 MG tablet Take 25 mg by mouth 2 (two) times daily.    ? Multiple Vitamin (MULTIVITAMIN) tablet Take 1 tablet by mouth daily.    ? Omega-3 Fatty Acids (OMEGA 3 PO) Take by mouth.    ? albuterol (VENTOLIN HFA) 108 (90 Base) MCG/ACT inhaler Inhale 1-2 puffs into the lungs every 6 (six) hours as needed for wheezing or shortness of breath. 18 g 0  ? ? ?No results found for this or any previous visit (from the past 48 hour(s)). ? ?No results found. ? ? ?Pertinent items are noted in HPI. ? ?Blood pressure (!) 153/68, pulse 73, temperature 98.2 ?F (36.8 ?C), temperature source Oral, resp. rate 16, height '5\' 4"'$  (1.626 m), weight 97.3 kg, SpO2 99 %. ? ?General appearance: alert, cooperative, and appears stated age ?Head: Normocephalic, without obvious abnormality ?Neck: no JVD ?Resp: clear to auscultation bilaterally ?Cardio: regular rate and rhythm, S1, S2 normal, no murmur, click, rub or gallop ?GI: soft, non-tender; bowel sounds normal; no masses,  no organomegaly ?Extremities: Mass left index finger ?Pulses: 2+ and symmetric ?Skin: Skin color, texture, turgor normal. No rashes or lesions ?Neurologic: Grossly normal ?Incision/Wound: ?na ? 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 Prepare postoperative course been discussed along with risk and complications. She is aware there is no guarantee to the surgery possibility of infection recurrence injury to arteries nerves tendons complete relief of symptoms dystrophy. Advised this may require 2 incisions to be able to adequately resect this. Will be scheduled as an outpatient under regional anesthesia for excision mass left index finger. Concern encouraged and answered to her satisfaction.  ?Daryll Brod ?01/08/2022, 11:36 AM ? ?  ?

## 2022-01-08 NOTE — Op Note (Signed)
NAME: [name] ?MEDICAL RECORD NO: [id number] ?DATE OF BIRTH: 1955/10/18 ?FACILITY: [facility] ?LOCATION: [place] ?PHYSICIAN: Wynonia Sours, MD ?  ?OPERATIVE REPORT ?  ?DATE OF PROCEDURE: 01/08/22  ?  ?PREOPERATIVE DIAGNOSIS: Mass left index finger ?  ?POSTOPERATIVE DIAGNOSIS: Same ?  ?PROCEDURE: Excision mass left index finger ?  ?SURGEON: Daryll Brod, M.D. ?  ?ASSISTANT: Leanora Cover, MD ?  ?ANESTHESIA:  Bier block with sedation and Local ?  ?INTRAVENOUS FLUIDS:  Per anesthesia flow sheet. ?  ?ESTIMATED BLOOD LOSS:  Minimal. ?  ?COMPLICATIONS:  None. ?  ?SPECIMENS: Mass 2.4 cm ?  ?TOURNIQUET TIME:   ? ?Total Tourniquet Time Documented: ?Upper Arm (Left) - 33 minutes ?Total: Upper Arm (Left) - 33 minutes ? ?  ?DISPOSITION:  Stable to PACU. ?  ?INDICATIONS: Patient is a 66 year old female with a large mass on the left index finger dorsal aspect with approximately 260 to 270 degrees of rotation around the middle phalanx distally.  At the length of three quarters of the middle phalanx.  She is desires having this removed.  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  Prepare postoperative course been discussed along with risks and complications.  She is aware there is no guarantee to the surgery possibility of infection recurrence injury to arteries nerves tendons complete relief of symptoms and dystrophy. ? ?OPERATIVE COURSE: Patient is brought to the operating room placed in a supine position with the left arm free.  A upper arm IV regional anesthetic was carried out without difficulty under direction of the anesthesia department.  Prep was done with ChloraPrep.  3-minute dry time allowed timeout taken confirm patient procedure.  A metacarpal block was given to the index finger with quarter percent bupivacaine without epinephrine approximately 9 cc was used.  A dorsal incision was made slightly radial over the distal phalangeal joint PIP joint carried down through subcutaneous tissue.  A  multilobulated brown-yellow mass was immediately encountered.  This had split the extensor tendon.  With blunt sharp dissection this was isolated around the radial and ulnar sides of the middle phalanx the extensor tendon was dissected free with blunt sharp dissection.  Bleeders were electrocauterized necessary with bipolar.  The most radial side of the mass was then isolated.  This was fully released from any soft tissue or bony constraints.  It was then passed with some difficulty under the extensor tendon on that side.  The dissection was then carried to the ulnar side of the finger and with careful blunt and sharp dissection this was dissected free down to the neurovascular and around the neurovascular bundle.  Which was then maintained.  The radial side of the extensor tendon was then freed up.  This allowed the 2 lobes of the radial side portion of the mass to be passed underneath the extensor tendon from the middle phalanx.  This allowed the mass to be expressed from the wound on the r ulnar side.  The further dissection was then carried lifting the mass which gave way to the soft tissues on the ulnar volar aspect of the finger.  The mass was excised in toto.  This was sent to pathology.  It measured 2.4 cm in greatest dimension.  The wound was copiously irrigated with saline.  The excess skin dorsally was then excised removing skin from both the radial and ulnar skin flaps.  The wound was then closed with interrupted 4-0 nylon sutures.  A sterile dressing and splint was applied to  the finger.  On deflation of the tourniquet remaining fingers pinked.  She was taken to the recovery room for observation in satisfactory condition.  She will be discharged home to return to the [facility] of [place] in 1 week on Ultram. ? ? ?Daryll Brod, MD ?Electronically signed, 01/08/22 ? ?

## 2022-01-08 NOTE — Anesthesia Preprocedure Evaluation (Addendum)
Anesthesia Evaluation  ?Patient identified by MRN, date of birth, ID band ?Patient awake ? ? ? ?Reviewed: ?Allergy & Precautions, NPO status , Patient's Chart, lab work & pertinent test results, reviewed documented beta blocker date and time  ? ?Airway ?Mallampati: II ? ?TM Distance: >3 FB ?Neck ROM: Full ? ? ? Dental ? ?(+) Chipped, Dental Advisory Given, Missing,  ?  ?Pulmonary ?sleep apnea and Continuous Positive Airway Pressure Ventilation ,  ?  ?Pulmonary exam normal ?breath sounds clear to auscultation ? ? ? ? ? ? Cardiovascular ?hypertension, Pt. on medications and Pt. on home beta blockers ?Normal cardiovascular exam ?Rhythm:Regular Rate:Normal ? ? ?  ?Neuro/Psych ?negative neurological ROS ? negative psych ROS  ? GI/Hepatic ?Neg liver ROS, GERD  ,  ?Endo/Other  ?negative endocrine ROS ? Renal/GU ?negative Renal ROS  ?negative genitourinary ?  ?Musculoskeletal ?negative musculoskeletal ROS ?(+)  ? Abdominal ?  ?Peds ? Hematology ?negative hematology ROS ?(+)   ?Anesthesia Other Findings ? ? Reproductive/Obstetrics ? ?  ? ? ? ? ? ? ? ? ? ? ? ? ? ?  ?  ? ? ? ? ? ? ? ?Anesthesia Physical ?Anesthesia Plan ? ?ASA: 3 ? ?Anesthesia Plan: MAC and Bier Block and Bier Block-LIDOCAINE ONLY  ? ?Post-op Pain Management: Tylenol PO (pre-op)*  ? ?Induction: Intravenous ? ?PONV Risk Score and Plan: Propofol infusion, Treatment may vary due to age or medical condition, Midazolam, Ondansetron and Dexamethasone ? ?Airway Management Planned: Natural Airway ? ?Additional Equipment:  ? ?Intra-op Plan:  ? ?Post-operative Plan:  ? ?Informed Consent: I have reviewed the patients History and Physical, chart, labs and discussed the procedure including the risks, benefits and alternatives for the proposed anesthesia with the patient or authorized representative who has indicated his/her understanding and acceptance.  ? ? ? ?Dental advisory given ? ?Plan Discussed with: CRNA ? ?Anesthesia Plan Comments:    ? ? ? ? ? ? ?Anesthesia Quick Evaluation ? ?

## 2022-01-08 NOTE — Anesthesia Procedure Notes (Signed)
Anesthesia Regional Block: Bier block (IV Regional)  ? ?Pre-Anesthetic Checklist: , timeout performed,  Correct Patient, Correct Site, Correct Laterality,  Correct Procedure,, site marked,  Surgical consent,  At surgeon's request ? ?Laterality: Left and Upper ? ?     ?  ?Needles:  ?Injection technique: Single-shot ? ?Needle Type: Other   ? ? ? ?Needle Gauge: 22  ? ? ? ?Additional Needles: ? ? ?Procedures:,,,,, intact distal pulses, Esmarch exsanguination,  Single tourniquet utilized    ?Narrative:  ?Start time: 01/08/2022 12:51 PM ?End time: 01/08/2022 12:52 PM ?Injection made incrementally with aspirations every 50 mL. ? ?Performed by: Personally  ? ? ? ?

## 2022-01-08 NOTE — Discharge Instructions (Addendum)
[facility] Instructions ?Hand Surgery ? ?Wound Care: ?Keep your hand elevated above the level of your heart.  Do not allow it to dangle by your side.  Keep the dressing dry and do not remove it unless your doctor advises you to do so.  He will usually change it at the time of your post-op visit.  Moving your fingers is advised to stimulate circulation but will depend on the site of your surgery.  If you have a splint applied, your doctor will advise you regarding movement. ? ?Activity: ?Do not drive or operate machinery today.  Rest today and then you may return to your normal activity and work as indicated by your physician. ? ?Diet:  ?Drink liquids today or eat a light diet.  You may resume a regular diet tomorrow.   ? ?General expectations: ?Pain for two to three days. ?Fingers may become slightly swollen. ? ?Call your doctor if any of the following occur: ?Severe pain not relieved by pain medication. ?Elevated temperature. ?Dressing soaked with blood. ?Inability to move fingers. ?White or bluish color to fingers.  ? ? ?Post Anesthesia Home Care Instructions ? ?Activity: ?Get plenty of rest for the remainder of the day. A responsible individual must stay with you for 24 hours following the procedure.  ?For the next 24 hours, DO NOT: ?-Drive a car ?-Paediatric nurse ?-Drink alcoholic beverages ?-Take any medication unless instructed by your physician ?-Make any legal decisions or sign important papers. ? ?Meals: ?Start with liquid foods such as gelatin or soup. Progress to regular foods as tolerated. Avoid greasy, spicy, heavy foods. If nausea and/or vomiting occur, drink only clear liquids until the nausea and/or vomiting subsides. Call your physician if vomiting continues. ? ?Special Instructions/Symptoms: ?Your throat may feel dry or sore from the anesthesia or the breathing tube placed in your throat during surgery. If this causes discomfort, gargle with warm salt water. The discomfort should disappear  within 24 hours. ? ?If you had a scopolamine patch placed behind your ear for the management of post- operative nausea and/or vomiting: ? ?1. The medication in the patch is effective for 72 hours, after which it should be removed.  Wrap patch in a tissue and discard in the trash. Wash hands thoroughly with soap and water. ?2. You may remove the patch earlier than 72 hours if you experience unpleasant side effects which may include dry mouth, dizziness or visual disturbances. ?3. Avoid touching the patch. Wash your hands with soap and water after contact with the patch. ?    ?May have Tylenol at [facility] today 01/08/2022 ?

## 2022-01-08 NOTE — Transfer of Care (Signed)
Immediate Anesthesia Transfer of Care Note ? ?Patient: [name] ? ?Procedure(s) Performed: EXCISION MASS LEFT INDEX FINGER (Left: Finger) ? ?Patient Location: PACU ? ?Anesthesia Type:MAC and Bier block ? ?Level of Consciousness: awake, alert  and oriented ? ?Airway & Oxygen Therapy: Patient Spontanous Breathing and Patient connected to face mask oxygen ? ?Post-op Assessment: Report given to RN and Post -op Vital signs reviewed and stable ? ?Post vital signs: Reviewed and stable ? ?Last Vitals:  ?Vitals Value Taken Time  ?BP 112/67 01/08/22 1334  ?Temp    ?Pulse 68 01/08/22 1335  ?Resp 14 01/08/22 1335  ?SpO2 97 % 01/08/22 1335  ? ? ?Last Pain:  ?Vitals:  ? 01/08/22 1100  ?TempSrc: Oral  ?PainSc: 0-No pain  ?   ? ?Patients Stated Pain Goal: 3 (01/08/22 1100) ? ?Complications: No notable events documented. ?

## 2022-01-08 NOTE — Anesthesia Postprocedure Evaluation (Signed)
Anesthesia Post Note ? ?Patient: [name] ? ?Procedure(s) Performed: EXCISION MASS LEFT INDEX FINGER (Left: Finger) ? ?  ? ?Patient location during evaluation: PACU ?Anesthesia Type: MAC and Bier Block ?Level of consciousness: awake and alert ?Pain management: pain level controlled ?Vital Signs Assessment: post-procedure vital signs reviewed and stable ?Respiratory status: spontaneous breathing, nonlabored ventilation, respiratory function stable and patient connected to nasal cannula oxygen ?Cardiovascular status: stable and blood pressure returned to baseline ?Postop Assessment: no apparent nausea or vomiting ?Anesthetic complications: no ? ? ?No notable events documented. ? ?Last Vitals:  ?Vitals:  ? 01/08/22 1345 01/08/22 1405  ?BP: 109/70 129/83  ?Pulse: 60 66  ?Resp: 15 15  ?Temp:  36.8 ?C  ?SpO2: 97% 97%  ?  ?Last Pain:  ?Vitals:  ? 01/08/22 1405  ?TempSrc:   ?PainSc: 0-No pain  ? ? ?  ?  ?  ?  ?  ?  ? ?Kylin Genna L Skanda Worlds ? ? ? ? ?

## 2022-01-08 NOTE — Brief Op Note (Signed)
01/08/2022 ? ?1:27 PM ? ?PATIENT:  [name]  66 y.o. female ? ?PRE-OPERATIVE DIAGNOSIS:  MASS LEFT INDEX FINGER ? ?POST-OPERATIVE DIAGNOSIS:  MASS LEFT INDEX FINGER ? ?PROCEDURE:  Procedure(s): ?EXCISION MASS LEFT INDEX FINGER (Left) ? ?SURGEON:  Surgeon(s) and Role: ?   Daryll Brod, MD - Primary ?   Leanora Cover, MD - Assisting ? ?PHYSICIAN ASSISTANT:  ? ?ASSISTANTS: K Lonna Rabold,MD  ? ?ANESTHESIA:   local, regional, and IV sedation ? ?EBL:  20m ? ?BLOOD ADMINISTERED:none ? ?DRAINS: none  ? ?LOCAL MEDICATIONS USED:  BUPIVICAINE  ? ?SPECIMEN:  Excision ? ?DISPOSITION OF SPECIMEN:  PATHOLOGY ? ?COUNTS:  YES ? ?TOURNIQUET:   ?Total Tourniquet Time Documented: ?Upper Arm (Left) - 33 minutes ?Total: Upper Arm (Left) - 33 minutes ? ? ?DICTATION: .Dragon Dictation ? ?PLAN OF CARE: Discharge to home after PACU ? ?PATIENT DISPOSITION:  PACU - hemodynamically stable. ?  ? ?

## 2022-01-09 ENCOUNTER — Encounter (HOSPITAL_BASED_OUTPATIENT_CLINIC_OR_DEPARTMENT_OTHER): Payer: Self-pay | Admitting: Orthopedic Surgery

## 2022-01-09 LAB — SURGICAL PATHOLOGY

## 2022-01-09 NOTE — Op Note (Signed)
I assisted Surgeon(s) and Role: ?   Daryll Brod, MD - Primary ?   Leanora Cover, MD - Assisting on the Procedure(s): ?EXCISION MASS LEFT INDEX FINGER on 01/08/2022.  I provided assistance on this case as follows: retraction soft tissues, mobilization of mass. ? ?Electronically signed by: Leanora Cover, MD ?Date: 01/09/2022 Time: 1:47 PM ? ?

## 2022-01-09 NOTE — Progress Notes (Signed)
Left message stating courtesy call and if any questions or concerns please call the doctors office.  

## 2024-01-06 DIAGNOSIS — I480 Paroxysmal atrial fibrillation: Secondary | ICD-10-CM

## 2024-01-06 HISTORY — DX: Paroxysmal atrial fibrillation: I48.0

## 2024-02-10 ENCOUNTER — Encounter: Payer: Self-pay | Admitting: Cardiology

## 2024-02-10 ENCOUNTER — Ambulatory Visit: Attending: Cardiology | Admitting: Cardiology

## 2024-02-10 VITALS — BP 146/80 | HR 89 | Ht 64.0 in | Wt 212.6 lb

## 2024-02-10 DIAGNOSIS — I10 Essential (primary) hypertension: Secondary | ICD-10-CM | POA: Diagnosis not present

## 2024-02-10 DIAGNOSIS — G4733 Obstructive sleep apnea (adult) (pediatric): Secondary | ICD-10-CM | POA: Diagnosis not present

## 2024-02-10 DIAGNOSIS — I4891 Unspecified atrial fibrillation: Secondary | ICD-10-CM

## 2024-02-10 DIAGNOSIS — I480 Paroxysmal atrial fibrillation: Secondary | ICD-10-CM

## 2024-02-10 DIAGNOSIS — Z7689 Persons encountering health services in other specified circumstances: Secondary | ICD-10-CM

## 2024-02-10 MED ORDER — MULTAQ 400 MG PO TABS: 400.0000 mg | ORAL_TABLET | Freq: Two times a day (BID) | ORAL | 6 refills | Status: DC

## 2024-02-10 MED ORDER — RIVAROXABAN 20 MG PO TABS: 20.0000 mg | ORAL_TABLET | Freq: Every day | ORAL | 2 refills | Status: DC

## 2024-02-10 NOTE — Patient Instructions (Signed)
 Medication Instructions:    Start  Multaq 400 mg  twice a day   Xarelto  20 mg   daily with the heaviest meal of the day ( Usually  @ 6pm)     *If you need a refill on your cardiac medications before your next appointment, please call your pharmacy*   Lab Work:  Not needed     Testing/Procedures:  Schedule at [address] Your physician has requested that you have an echocardiogram. Echocardiography is a painless test that uses sound waves to create images of your heart. It provides your doctor with information about the size and shape of your heart and how well your heart's chambers and valves are working. This procedure takes approximately one hour. There are no restrictions for this procedure. Please do NOT wear cologne, perfume, aftershave, or lotions (deodorant is allowed). Please arrive 15 minutes prior to your appointment time.  Please note: We ask at that you not bring children with you during ultrasound (echo/ vascular) testing. Due to room size and safety concerns, children are not allowed in the ultrasound rooms during exams. Our front office staff cannot provide observation of children in our lobby area while testing is being conducted. An adult accompanying a patient to their appointment will only be allowed in the ultrasound room at the discretion of the ultrasound technician under special circumstances. We apologize for any inconvenience.    Follow-Up: At [facility], you and your health needs are our priority.  As part of our continuing mission to provide you with exceptional heart care, we have created designated Provider Care Teams.  These Care Teams include your primary Cardiologist (physician) and Advanced Practice Providers (APPs -  Physician Assistants and Nurse Practitioners) who all work together to provide you with the care you need, when you need it.  We recommend signing up for the patient portal called "MyChart".  Sign up information is provided  on this After Visit Summary.  MyChart is used to connect with patients for Virtual Visits (Telemedicine).  Patients are able to view lab/test results, encounter notes, upcoming appointments, etc.  Non-urgent messages can be sent to your provider as well.   To learn more about what you can do with MyChart, go to [email or web address].    Your next appointment:   2 month(s)  The format for your next appointment:   In Person  Provider:   Randene Bustard, MD   Other Instructions

## 2024-02-10 NOTE — Progress Notes (Signed)
 Cardiology Office Note:  .   Date:  02/13/2024  ID:  Barclay Leyden, DOB 1956/06/10, MRN [id number] PCP: Alejandro Hurt, FNP  [facility] Providers Cardiologist:  Randene Bustard, MD     Chief Complaint  Patient presents with   New Patient (Initial Visit)    New diagnosis of A-fib   Atrial Fibrillation    Recently diagnosed.    Patient Profile: .     [name] is a moderately obese 68 y.o. female non-smoker with a PMH notable for HTN, HLD who presents here for Evaluation of Atrial Fibrillation at the request of Alejandro Hurt, FNP.     [name] was last seen on 02/05/2024 by Sharry Deem, FNP was noted to have a otitis media infection (started on antibiotics).  This was also to follow-up Zio patch monitor which showed atrial fibs.  Beta-blocker-metoprolol succinate (Toprol-XL) was increased to 100 mg daily.  Referred to cardiology.  Subjective  Discussed the use of AI scribe software for clinical note transcription with the patient, who gave verbal consent to proceed.  History of Present Illness History of Present Illness [name] is a 68 year old female with atrial fibrillation who presents with strong palpitations and irregular heartbeats. She was referred by her primary care physician for evaluation of atrial fibrillation.  She has been experiencing strong palpitations and irregular heartbeats since March, describing them as feeling like she is 'running a marathon' while sitting down. These episodes vary in duration from a few minutes to several hours. A heart monitor worn for two weeks confirmed paroxysmal atrial fibrillation.  Open fortunately, do not have the Zio patch report review  She is currently on Norvasc (amlodipine) 10 mg daily for blood pressure management. Despite the medication adjustment, there has been no improvement in her symptoms.  During episodes of irregular heartbeat, she does not experience chest pain but feels 'kind of weird' and  sometimes experiences nausea and dizziness. No shortness of breath during these episodes. She also reports feeling tired at times but does not experience chest pain or shortness of breath during daily activities.  Her past medical history includes obstructive sleep apnea for which she uses a CPAP machine. No history of stroke or diabetes.     Objective   Meds from PCPs note: Amlodipine 5 mg daily, Toprol-XL 100 mg daily, atorvastatin 10 mg daily, omega-3 fatty acids 1 capsule daily non-smoker  Studies Reviewed: Aaron Aas   EKG Interpretation Date/Time:  Tuesday Feb 10 2024 14:25:51 EDT Ventricular Rate:  77 PR Interval:  146 QRS Duration:  76 QT Interval:  376 QTC Calculation: 425 R Axis:   21  Text Interpretation: Normal sinus rhythm Nonspecific ST abnormality When compared with ECG of 13-Aug-2007 13:31, No significant change was found Confirmed by Randene Bustard ([id number]) on 02/10/2024 2:47:31 PM    14-day Zio patch monitor: Results not available but by report-PAF noted.  Risk Assessment/Calculations:    CHA2DS2-VASc Score = 3   This indicates a 3.2% annual risk of stroke. The patient's score is based upon: CHF History: 0 HTN History: 1 Diabetes History: 0 Stroke History: 0 Vascular Disease History: 0 Age Score: 1 Gender Score: 1   No data recorded Has Bled Score 1  Started on Eliquis HYPERTENSION CONTROL Vitals:   02/10/24 1423 1440  BP: (!) 150/86 (!) 146/80    The patient's blood pressure is elevated above target today. Recently had Toprol dose increased, will monitor as pressures at home  are better controlled.  Suspect some component of white coat syndrome         Physical Exam:   VS:  BP (!) 146/80   Pulse 89   Ht 5\' 4"  (1.626 m)   Wt 212 lb 9.6 oz (96.4 kg)   SpO2 98%   BMI 36.49 kg/m    Wt Readings from Last 3 Encounters:  02/10/24 212 lb 9.6 oz (96.4 kg)  01/08/22 214 lb 8.1 oz (97.3 kg)  04/13/19 179 lb (81.2 kg)    GEN: Well nourished, well groomed in  no acute distress; mildly obese NECK: No JVD; No carotid bruits CARDIAC: Normal S1, S2; RRR, no murmurs, rubs, gallops RESPIRATORY:  Clear to auscultation without rales, wheezing or rhonchi ; nonlabored, good air movement. ABDOMEN: Soft, non-tender, non-distended EXTREMITIES:  No edema; No deformity      ASSESSMENT AND PLAN: .    Problem List Items Addressed This Visit       Cardiology Problems   Paroxysmal atrial fibrillation (HCC) (Chronic)   Confirmed by two-week monitor. CHADS-VASc score of 3 indicates moderate stroke risk. Discussed pathophysiology, stroke risk, and anticoagulation importance. Explained rate control with metoprolol, rhythm control with Multaq , and 'pill in the pocket' strategy. Xarelto  chosen for anticoagulation due to safety profile. Multaq 's efficacy variable, potential nausea. - Order echocardiogram to assess cardiac structure and function. => Low threshold for ischemic evaluation if abnormal. - Initiate Xarelto  for anticoagulation, to be taken with the largest meal of the day. - Start Multaq  for rhythm control, monitor for efficacy and side effects. - Review monitor results to assess atrial fibrillation burden. - Provide education on atrial fibrillation management.      Relevant Medications   metoprolol succinate (TOPROL-XL) 100 MG 24 hr tablet   atorvastatin (LIPITOR) 10 MG tablet   rivaroxaban  (XARELTO ) 20 MG TABS tablet   dronedarone  (MULTAQ ) 400 MG tablet   Primary hypertension (Chronic)   Elevated blood pressure, possibly due to anxiety or sinus infection. Managed with Toprol-XL 100 mg daily and amlodipine 10 mg daily. -Next medication would be ARB plus or minus HCTZ.      Relevant Medications   metoprolol succinate (TOPROL-XL) 100 MG 24 hr tablet   atorvastatin (LIPITOR) 10 MG tablet   rivaroxaban  (XARELTO ) 20 MG TABS tablet   dronedarone  (MULTAQ ) 400 MG tablet     Other   OSA on CPAP   Managed with CPAP. Discussed its association with atrial  fibrillation.      Other Visit Diagnoses       New onset a-fib (HCC)    -  Primary   Relevant Medications   metoprolol succinate (TOPROL-XL) 100 MG 24 hr tablet   atorvastatin (LIPITOR) 10 MG tablet   rivaroxaban  (XARELTO ) 20 MG TABS tablet   dronedarone  (MULTAQ ) 400 MG tablet   Other Relevant Orders   ECHOCARDIOGRAM COMPLETE     Encounter to establish care       Relevant Orders   EKG 12-Lead (Completed)   ECHOCARDIOGRAM COMPLETE             Follow-Up: Return in about 2 months (around 04/11/2024) for Routine follow up with me, Routine Follow-up after testing ~ 1-2 months. - Schedule follow-up in 2-3 months to assess Multaq  response and review echocardiogram results.    Myrtle Atta, Arleen Lacer, MD, MS Randene Bustard, M.D., M.S. Interventional Chartered certified accountant  Pager # [phone]

## 2024-02-13 ENCOUNTER — Encounter: Payer: Self-pay | Admitting: Cardiology

## 2024-02-13 DIAGNOSIS — G4733 Obstructive sleep apnea (adult) (pediatric): Secondary | ICD-10-CM | POA: Insufficient documentation

## 2024-02-13 DIAGNOSIS — I480 Paroxysmal atrial fibrillation: Secondary | ICD-10-CM | POA: Insufficient documentation

## 2024-02-13 DIAGNOSIS — I10 Essential (primary) hypertension: Secondary | ICD-10-CM | POA: Insufficient documentation

## 2024-02-13 NOTE — Assessment & Plan Note (Signed)
 Elevated blood pressure, possibly due to anxiety or sinus infection. Managed with Toprol-XL 100 mg daily and amlodipine 10 mg daily. -Next medication would be ARB plus or minus HCTZ.

## 2024-02-13 NOTE — Assessment & Plan Note (Addendum)
 Confirmed by two-week monitor. CHADS-VASc score of 3 indicates moderate stroke risk. Discussed pathophysiology, stroke risk, and anticoagulation importance. Explained rate control with metoprolol, rhythm control with Multaq , and 'pill in the pocket' strategy. Xarelto  chosen for anticoagulation due to safety profile. Multaq 's efficacy variable, potential nausea. - Order echocardiogram to assess cardiac structure and function. => Low threshold for ischemic evaluation if abnormal. - Initiate Xarelto  for anticoagulation, to be taken with the largest meal of the day. - Start Multaq  for rhythm control, monitor for efficacy and side effects. - Review monitor results to assess atrial fibrillation burden. - Provide education on atrial fibrillation management.

## 2024-02-13 NOTE — Assessment & Plan Note (Signed)
 Managed with CPAP. Discussed its association with atrial fibrillation.

## 2024-02-24 ENCOUNTER — Encounter: Payer: Self-pay | Admitting: Cardiology

## 2024-03-18 ENCOUNTER — Ambulatory Visit (HOSPITAL_COMMUNITY)
Admission: RE | Admit: 2024-03-18 | Discharge: 2024-03-18 | Disposition: A | Discharge status: Home / Self Care | Source: Ambulatory Visit | Attending: Cardiology | Admitting: Cardiology

## 2024-03-18 DIAGNOSIS — I4891 Unspecified atrial fibrillation: Secondary | ICD-10-CM

## 2024-03-18 DIAGNOSIS — Z7689 Persons encountering health services in other specified circumstances: Secondary | ICD-10-CM | POA: Diagnosis present

## 2024-03-18 LAB — ECHOCARDIOGRAM COMPLETE
Area-P 1/2: 3.5 cm2
S' Lateral: 2.8 cm

## 2024-03-19 ENCOUNTER — Ambulatory Visit: Payer: Self-pay | Admitting: Cardiology

## 2024-03-19 NOTE — Telephone Encounter (Signed)
-----   Message from Randene Bustard sent at 03/19/2024  3:39 PM EDT ----- Echocardiogram results are very reassuring.  Normal left ventricular size and function.  The ejection fraction (how much the heart pumps out) is 60 to 65%.  Normal ranges 50 to 70%.  No wall motion  abnormality to suggest suggest any prior injury to the heart. Normal right ventricle.  Normal valves.  Normal pressures in the heart.  Thankfully, the atria seem to be normal as well.   Randene Bustard, MD  ----- Message ----- From: Interface, Three One Seven Sent: 03/18/2024   1:31 PM EDT To: Arleen Lacer, MD

## 2024-03-19 NOTE — Telephone Encounter (Signed)
 Left message on voicemail to review result in MyChart. If you would like to verbal receive result please call [phone]

## 2024-04-14 ENCOUNTER — Ambulatory Visit: Attending: Cardiovascular Disease | Admitting: Cardiology

## 2024-04-14 ENCOUNTER — Other Ambulatory Visit (HOSPITAL_COMMUNITY): Payer: Self-pay

## 2024-04-14 ENCOUNTER — Encounter: Payer: Self-pay | Admitting: Cardiology

## 2024-04-14 VITALS — BP 134/68 | HR 67 | Ht 64.0 in | Wt 213.6 lb

## 2024-04-14 DIAGNOSIS — I10 Essential (primary) hypertension: Secondary | ICD-10-CM

## 2024-04-14 DIAGNOSIS — G4733 Obstructive sleep apnea (adult) (pediatric): Secondary | ICD-10-CM | POA: Diagnosis not present

## 2024-04-14 DIAGNOSIS — I480 Paroxysmal atrial fibrillation: Secondary | ICD-10-CM | POA: Diagnosis not present

## 2024-04-14 MED ORDER — RIVAROXABAN 20 MG PO TABS
20.0000 mg | ORAL_TABLET | Freq: Every day | ORAL | 3 refills | Status: DC
  Filled 2024-04-14 – 2024-10-28 (×2): qty 90, 90d supply, fill #0

## 2024-04-14 MED ORDER — MULTAQ 400 MG PO TABS
400.0000 mg | ORAL_TABLET | Freq: Two times a day (BID) | ORAL | 3 refills | Status: DC
  Filled 2024-04-14: qty 180, 90d supply, fill #0

## 2024-04-14 NOTE — Patient Instructions (Signed)
 Medication Instructions:  No changes   *If you need a refill on your cardiac medications before your next appointment, please call your pharmacy*   Lab Work: Not needed Testing/Procedures:  Not needed  Follow-Up: At [facility], you and your health needs are our priority.  As part of our continuing mission to provide you with exceptional heart care, we have created designated Provider Care Teams.  These Care Teams include your primary Cardiologist (physician) and Advanced Practice Providers (APPs -  Physician Assistants and Nurse Practitioners) who all work together to provide you with the care you need, when you need it.     Your next appointment:   6 month(s)  The format for your next appointment:   In Person  Provider:   Alm Clay, MD or Aline Door, PA-C, Rollo Louder, PA-C, Damien Braver, NP, or Katlyn West, NP  .   Other Instructions

## 2024-04-14 NOTE — Progress Notes (Signed)
 Cardiology Office Note:  .   Date:  04/20/2024  ID:  [name], DOB 07-Mar-1956, MRN [id number] PCP: Marvene Prentice SAUNDERS, FNP  [facility] Providers Cardiologist:  Alm Clay, MD     Chief Complaint  Patient presents with   Follow-up    67-month follow-up after starting Multaq    Atrial Fibrillation    Seems be doing well on Multaq .  If no breakthrough spells    Patient Profile: .     [name] is a moderately obese 68 y.o. female non-smoker with a PMH notable for OSA-CPAP, HTN, HLD and recently diagnosed A-fib who presents here for 68-month follow-up to discuss test results.   No CVA or diabetes.     [name] was last seen on 02/10/2024 at the request of Marvene Prentice SAUNDERS, FNP with a new diagnosis of A-fib.  She had noted episodes of strong palpitations and irregular heartbeat since March.  She felt as though she was running a marathon while sitting down.  Apparently Zio patch showed PAF.  She denied any chest pain or pressure associated with these episodes but did feel just weird and sometimes nauseated/dizzy.  No real significant dyspnea just tired.  Discussed the association of OSA has been treated with reports of CPAP. Started on Xarelto  20 mg Started on Multaq  for attempted rhythm control along with 100 mg Toprol for rate control Ordered Echocardiogram  Subjective  Discussed the use of AI scribe software for clinical note transcription with the patient, who gave verbal consent to proceed.  History of Present Illness  [name] is a 68 year old female with atrial fibrillation who presents for follow-up regarding her heart rhythm management. She was referred by [facility] for an event monitor test in April.  She experiences occasional episodes of palpitations, described as heart racing, skipping, or flipping, with only one or two brief episodes since her last visit two months ago. These episodes last only minutes and are less prolonged than  before.  She was started on Multaq , which initially caused nausea and diarrhea, but these side effects have subsided. She is also on Xarelto  and notes increased bruising but no blood in stools or urine. Her current medications include Multaq , Xarelto , Toprol, and amlodipine, with the latter increased from 2.5 mg to 5 mg. She uses Tylenol  for back pain due to arthritis and a history of back surgery, avoiding Advil.  No chest pain, pressure, or tightness. No prolonged fluttering or heart racing. No shortness of breath during activities or while lying flat, and no leg swelling. Her heart rate can increase to 100 during walking, indicating good exercise tolerance.  She routinely uses her CPAP machine.     Objective   Medications - Multaq  400 mg twice daily - Xarelto  20 mg daily - Toprol 100 mg daily - Amlodipine 5 mg daily - Atorvastatin 10 mg daily  Studies Reviewed: .        Results DIAGNOSTIC Echocardiogram: Ejection fraction 60-65%, no wall motion abnormalities, diastolic function normal.  Normal RV.  Normal valve function, normal heart pressures (03/18/2024)  Due for labs checked by PCP-most recent labs 05/27/2023: TC 214, TG 71, HDL 91, LDL 110.  Hgb 14.1.  Risk Assessment/Calculations:    CHA2DS2-VASc Score = 3   This indicates a 3.2% annual risk of stroke. The patient's score is based upon: CHF History: 0 HTN History: 1 Diabetes History: 0 Stroke History: 0 Vascular Disease History: 0 Age Score: 1 Gender Score:  1           Physical Exam:   VS:  BP 134/68 (BP Location: Left Arm, Patient Position: Sitting, Cuff Size: Large)   Pulse 67   Ht 5' 4 (1.626 m)   Wt 213 lb 9.6 oz (96.9 kg)   SpO2 99%   BMI 36.66 kg/m    Wt Readings from Last 3 Encounters:  04/14/24 213 lb 9.6 oz (96.9 kg)  02/10/24 212 lb 9.6 oz (96.4 kg)  01/08/22 214 lb 8.1 oz (97.3 kg)    GEN: Well nourished, well groomed in no acute distress; moderately obese; otherwise  healthy-appearing. NECK: No JVD; No carotid bruits CARDIAC: Normal S1, S2; RRR, no murmurs, rubs, gallops RESPIRATORY:  Clear to auscultation without rales, wheezing or rhonchi ; nonlabored, good air movement. ABDOMEN: Soft, non-tender, non-distended EXTREMITIES:  No edema; No deformity     ASSESSMENT AND PLAN: .    Problem List Items Addressed This Visit       Cardiology Problems   Paroxysmal atrial fibrillation (HCC) - Primary (Chronic)   Atrial fibrillation episodes reduced with Multaq . Initial side effects resolved.  Heart rate control adequate on current dose of Multaq  40 mg twice daily plus Toprol 100 mg daily.  Seems to be tolerating Xarelto  20 mg daily with no significant bleeding. Discussed future treatment options if Multaq  loses efficacy. Echocardiogram normal.  - Continue Multaq  and Toprol along with Xarelto  as prescribed. - Change Multaq  prescription to 90-day supply. - Monitor for prolonged AFib episodes; consider cardioversion if symptomatic. - Consider alternative medications or ablation if Multaq  becomes ineffective. - Educate on signs of AFib and when to seek emergency care.      Relevant Medications   amLODipine (NORVASC) 5 MG tablet   rivaroxaban  (XARELTO ) 20 MG TABS tablet   dronedarone  (MULTAQ ) 400 MG tablet   Primary hypertension (Chronic)   Blood pressure well-controlled today on amlodipine 5 mg daily and Toprol 100 mg daily. - Continue current antihypertensive regimen.      Relevant Medications   amLODipine (NORVASC) 5 MG tablet   rivaroxaban  (XARELTO ) 20 MG TABS tablet   dronedarone  (MULTAQ ) 400 MG tablet     Other   OSA on CPAP   Using CPAP with no adverse cardiac effects noted. - Continue CPAP therapy.       Assessment & Plan NON-CARDIAC   Arthritis Arthritis with occasional flare-ups. Avoids NSAIDs, uses Tylenol  for pain. - Continue using Tylenol  for pain management as needed.      Follow-Up: Return in about 6 months (around  10/15/2024) for 6 month follow-up with me, [employer].     Signed, Alm MICAEL Clay, MD, MS Alm Clay, M.D., M.S. Interventional Chartered certified accountant  Pager # [phone]

## 2024-04-20 ENCOUNTER — Encounter: Payer: Self-pay | Admitting: Cardiology

## 2024-04-20 NOTE — Assessment & Plan Note (Signed)
 Using CPAP with no adverse cardiac effects noted. - Continue CPAP therapy.

## 2024-04-20 NOTE — Assessment & Plan Note (Signed)
 Blood pressure well-controlled today on amlodipine 5 mg daily and Toprol 100 mg daily. - Continue current antihypertensive regimen.

## 2024-04-20 NOTE — Assessment & Plan Note (Addendum)
 Atrial fibrillation episodes reduced with Multaq . Initial side effects resolved.  Heart rate control adequate on current dose of Multaq  40 mg twice daily plus Toprol 100 mg daily.  Seems to be tolerating Xarelto  20 mg daily with no significant bleeding. Discussed future treatment options if Multaq  loses efficacy. Echocardiogram normal.  - Continue Multaq  and Toprol along with Xarelto  as prescribed. - Change Multaq  prescription to 90-day supply. - Monitor for prolonged AFib episodes; consider cardioversion if symptomatic. - Consider alternative medications or ablation if Multaq  becomes ineffective. - Educate on signs of AFib and when to seek emergency care.

## 2024-07-07 ENCOUNTER — Encounter: Payer: Self-pay | Admitting: Cardiology

## 2024-08-04 ENCOUNTER — Other Ambulatory Visit (HOSPITAL_COMMUNITY): Payer: Self-pay

## 2024-08-04 MED ORDER — DRONEDARONE HCL 400 MG PO TABS
400.0000 mg | ORAL_TABLET | Freq: Two times a day (BID) | ORAL | 0 refills | Status: DC
Start: 2024-02-10 — End: 2024-09-01
  Filled 2024-08-04: qty 60, 30d supply, fill #0

## 2024-08-31 ENCOUNTER — Other Ambulatory Visit: Payer: Self-pay | Admitting: Cardiology

## 2024-08-31 ENCOUNTER — Other Ambulatory Visit (HOSPITAL_COMMUNITY): Payer: Self-pay

## 2024-08-31 DIAGNOSIS — I480 Paroxysmal atrial fibrillation: Secondary | ICD-10-CM

## 2024-09-01 ENCOUNTER — Other Ambulatory Visit (HOSPITAL_COMMUNITY): Payer: Self-pay

## 2024-09-01 ENCOUNTER — Other Ambulatory Visit: Payer: Self-pay

## 2024-09-01 MED FILL — Dronedarone HCl Tab 400 MG (Base Equivalent): ORAL | 30 days supply | Qty: 60 | Fill #0 | Status: AC

## 2024-09-03 ENCOUNTER — Other Ambulatory Visit (HOSPITAL_COMMUNITY): Payer: Self-pay

## 2024-09-06 ENCOUNTER — Other Ambulatory Visit (HOSPITAL_COMMUNITY): Payer: Self-pay

## 2024-09-30 MED FILL — Dronedarone HCl Tab 400 MG (Base Equivalent): ORAL | 30 days supply | Qty: 60 | Fill #1 | Status: AC

## 2024-10-27 ENCOUNTER — Ambulatory Visit: Admitting: Cardiology

## 2024-10-28 ENCOUNTER — Other Ambulatory Visit: Payer: Self-pay

## 2024-10-28 ENCOUNTER — Other Ambulatory Visit (HOSPITAL_COMMUNITY): Payer: Self-pay

## 2024-10-28 ENCOUNTER — Other Ambulatory Visit: Payer: Self-pay | Admitting: Cardiology

## 2024-10-29 ENCOUNTER — Other Ambulatory Visit (HOSPITAL_COMMUNITY): Payer: Self-pay

## 2024-10-30 ENCOUNTER — Other Ambulatory Visit: Payer: Self-pay

## 2024-10-30 ENCOUNTER — Other Ambulatory Visit (HOSPITAL_COMMUNITY): Payer: Self-pay

## 2024-10-30 MED ORDER — AMLODIPINE BESYLATE 5 MG PO TABS
5.0000 mg | ORAL_TABLET | Freq: Every day | ORAL | 1 refills | Status: AC
  Filled 2024-10-30: qty 90, 90d supply, fill #0

## 2024-10-30 MED ORDER — ATORVASTATIN CALCIUM 10 MG PO TABS
10.0000 mg | ORAL_TABLET | Freq: Every day | ORAL | 3 refills | Status: AC
  Filled 2024-10-30: qty 90, 90d supply, fill #0

## 2024-10-30 MED ORDER — METOPROLOL SUCCINATE ER 50 MG PO TB24: 50.0000 mg | ORAL_TABLET | Freq: Every day | ORAL | 3 refills | Status: AC

## 2024-10-30 MED FILL — Rivaroxaban Tab 20 MG: ORAL | 90 days supply | Qty: 90 | Fill #0 | Status: AC

## 2024-11-02 ENCOUNTER — Other Ambulatory Visit (HOSPITAL_COMMUNITY): Payer: Self-pay

## 2024-11-07 MED FILL — Dronedarone HCl Tab 400 MG (Base Equivalent): ORAL | 30 days supply | Qty: 60 | Fill #2 | Status: AC

## 2024-11-29 ENCOUNTER — Ambulatory Visit: Admitting: Nurse Practitioner
